# Patient Record
Sex: Male | Born: 1950 | Race: White | Hispanic: No | Marital: Married | Smoking: Never smoker
Health system: Southern US, Community
[De-identification: ages and names within clinical notes are randomized; demographics above are authoritative.]

## PROBLEM LIST (undated history)

## (undated) DIAGNOSIS — E669 Obesity, unspecified: Secondary | ICD-10-CM

## (undated) DIAGNOSIS — R7983 Abnormal findings of blood amino-acid level: Secondary | ICD-10-CM

## (undated) DIAGNOSIS — N184 Chronic kidney disease, stage 4 (severe): Secondary | ICD-10-CM

## (undated) DIAGNOSIS — K859 Acute pancreatitis without necrosis or infection, unspecified: Secondary | ICD-10-CM

## (undated) DIAGNOSIS — I2510 Atherosclerotic heart disease of native coronary artery without angina pectoris: Secondary | ICD-10-CM

## (undated) DIAGNOSIS — E7211 Homocystinuria: Secondary | ICD-10-CM

## (undated) DIAGNOSIS — F3162 Bipolar disorder, current episode mixed, moderate: Secondary | ICD-10-CM

## (undated) DIAGNOSIS — G4730 Sleep apnea, unspecified: Secondary | ICD-10-CM

## (undated) DIAGNOSIS — G463 Brain stem stroke syndrome: Secondary | ICD-10-CM

## (undated) DIAGNOSIS — M519 Unspecified thoracic, thoracolumbar and lumbosacral intervertebral disc disorder: Secondary | ICD-10-CM

## (undated) DIAGNOSIS — I10 Essential (primary) hypertension: Secondary | ICD-10-CM

## (undated) DIAGNOSIS — E785 Hyperlipidemia, unspecified: Secondary | ICD-10-CM

## (undated) DIAGNOSIS — N200 Calculus of kidney: Secondary | ICD-10-CM

## (undated) DIAGNOSIS — N400 Enlarged prostate without lower urinary tract symptoms: Secondary | ICD-10-CM

## (undated) HISTORY — DX: Homocystinuria: E72.11

## (undated) HISTORY — DX: Calculus of kidney: N20.0

## (undated) HISTORY — DX: Hyperlipidemia, unspecified: E78.5

## (undated) HISTORY — DX: Abnormal findings of blood amino-acid level: R79.83

## (undated) HISTORY — DX: Sleep apnea, unspecified: G47.30

## (undated) HISTORY — PX: ANKLE SURGERY: SHX546

## (undated) HISTORY — DX: Essential (primary) hypertension: I10

## (undated) HISTORY — PX: NASAL SEPTUM SURGERY: SHX37

## (undated) HISTORY — DX: Brain stem stroke syndrome: G46.3

## (undated) HISTORY — PX: COSMETIC SURGERY: SHX468

## (undated) HISTORY — DX: Bipolar disorder, current episode mixed, moderate: F31.62

## (undated) HISTORY — DX: Benign prostatic hyperplasia without lower urinary tract symptoms: N40.0

## (undated) HISTORY — PX: LIPOSUCTION: SHX10

## (undated) HISTORY — PX: ANKLE ARTHROSCOPY: SUR85

## (undated) HISTORY — DX: Obesity, unspecified: E66.9

## (undated) HISTORY — DX: Atherosclerotic heart disease of native coronary artery without angina pectoris: I25.10

---

## 1998-10-29 ENCOUNTER — Ambulatory Visit (HOSPITAL_COMMUNITY): Admission: RE | Admit: 1998-10-29 | Discharge: 1998-10-29 | Payer: Self-pay | Admitting: Internal Medicine

## 1998-10-29 ENCOUNTER — Encounter: Payer: Self-pay | Admitting: Internal Medicine

## 1998-11-13 ENCOUNTER — Encounter: Payer: Self-pay | Admitting: Internal Medicine

## 1998-11-13 ENCOUNTER — Ambulatory Visit (HOSPITAL_COMMUNITY): Admission: RE | Admit: 1998-11-13 | Discharge: 1998-11-13 | Payer: Self-pay | Admitting: Internal Medicine

## 2001-05-18 ENCOUNTER — Encounter: Payer: Self-pay | Admitting: Internal Medicine

## 2001-05-18 ENCOUNTER — Inpatient Hospital Stay (HOSPITAL_COMMUNITY): Admission: EM | Admit: 2001-05-18 | Discharge: 2001-05-20 | Payer: Self-pay | Admitting: Emergency Medicine

## 2001-05-19 ENCOUNTER — Encounter: Payer: Self-pay | Admitting: Internal Medicine

## 2002-06-29 ENCOUNTER — Encounter: Payer: Self-pay | Admitting: Internal Medicine

## 2004-07-22 ENCOUNTER — Encounter: Admission: RE | Admit: 2004-07-22 | Discharge: 2004-07-22 | Payer: Self-pay | Admitting: Sports Medicine

## 2004-12-09 ENCOUNTER — Ambulatory Visit: Payer: Self-pay | Admitting: Internal Medicine

## 2006-02-11 ENCOUNTER — Ambulatory Visit: Payer: Self-pay | Admitting: Internal Medicine

## 2006-02-11 LAB — CONVERTED CEMR LAB
ALT: 27 units/L (ref 0–40)
AST: 19 units/L (ref 0–37)
Albumin: 4.4 g/dL (ref 3.5–5.2)
Alkaline Phosphatase: 75 units/L (ref 39–117)
BUN: 49 mg/dL — ABNORMAL HIGH (ref 6–23)
Basophils Absolute: 0.1 10*3/uL (ref 0.0–0.1)
Basophils Relative: 0.6 % (ref 0.0–1.0)
CO2: 25 meq/L (ref 19–32)
Calcium: 9.8 mg/dL (ref 8.4–10.5)
Chloride: 107 meq/L (ref 96–112)
Chol/HDL Ratio, serum: 6
Cholesterol: 239 mg/dL (ref 0–200)
Creatinine, Ser: 2.3 mg/dL — ABNORMAL HIGH (ref 0.4–1.5)
Eosinophil percent: 2.9 % (ref 0.0–5.0)
GFR calc non Af Amer: 32 mL/min
Glomerular Filtration Rate, Af Am: 38 mL/min/{1.73_m2}
Glucose, Bld: 94 mg/dL (ref 70–99)
HCT: 46.8 % (ref 39.0–52.0)
HDL: 40 mg/dL (ref >39.0)
Hemoglobin: 14.9 g/dL (ref 13.0–17.0)
LDL DIRECT: 194.7 mg/dL
Lymphocytes Relative: 28.1 % (ref 12.0–46.0)
MCHC: 31.7 g/dL (ref 30.0–36.0)
MCV: 84.3 fL (ref 78.0–100.0)
Monocytes Absolute: 1 10*3/uL — ABNORMAL HIGH (ref 0.2–0.7)
Monocytes Relative: 10.1 % (ref 3.0–11.0)
Neutro Abs: 5.8 10*3/uL (ref 1.4–7.7)
Neutrophils Relative %: 58.3 % (ref 43.0–77.0)
PSA: 1.33 ng/mL (ref 0.10–4.00)
Platelets: 272 10*3/uL (ref 150–400)
Potassium: 4.3 meq/L (ref 3.5–5.1)
RBC: 5.55 M/uL (ref 4.22–5.81)
RDW: 13.9 % (ref 11.5–14.6)
Sodium: 140 meq/L (ref 135–145)
TSH: 6.61 microintl units/mL — ABNORMAL HIGH (ref 0.35–5.50)
Total Bilirubin: 0.8 mg/dL (ref 0.3–1.2)
Total Protein: 7.1 g/dL (ref 6.0–8.3)
Triglyceride fasting, serum: 103 mg/dL (ref 0–149)
VLDL: 21 mg/dL (ref 0–40)
WBC: 10 10*3/uL (ref 4.5–10.5)

## 2006-02-25 ENCOUNTER — Ambulatory Visit: Payer: Self-pay | Admitting: Internal Medicine

## 2006-04-01 ENCOUNTER — Ambulatory Visit: Payer: Self-pay | Admitting: Internal Medicine

## 2006-06-30 ENCOUNTER — Ambulatory Visit: Payer: Self-pay | Admitting: Internal Medicine

## 2006-06-30 LAB — CONVERTED CEMR LAB
ALT: 19 units/L (ref 0–40)
AST: 16 units/L (ref 0–37)
Albumin: 3.7 g/dL (ref 3.5–5.2)
Alkaline Phosphatase: 75 units/L (ref 39–117)
BUN: 35 mg/dL — ABNORMAL HIGH (ref 6–23)
Bilirubin, Direct: 0.1 mg/dL (ref 0.0–0.3)
CO2: 27 meq/L (ref 19–32)
Calcium: 8.9 mg/dL (ref 8.4–10.5)
Chloride: 111 meq/L (ref 96–112)
Cholesterol: 249 mg/dL (ref 0–200)
Creatinine, Ser: 1.7 mg/dL — ABNORMAL HIGH (ref 0.4–1.5)
Creatinine,U: 79.3 mg/dL
Direct LDL: 177.7 mg/dL
Free T4: 0.6 ng/dL (ref 0.6–1.6)
GFR calc Af Amer: 54 mL/min
GFR calc non Af Amer: 45 mL/min
Glucose, Bld: 113 mg/dL — ABNORMAL HIGH (ref 70–99)
HDL: 42.1 mg/dL (ref >39.0)
Hgb A1c MFr Bld: 6.5 % — ABNORMAL HIGH (ref 4.6–6.0)
Microalb Creat Ratio: 11.3 mg/g (ref 0.0–30.0)
Microalb, Ur: 0.9 mg/dL (ref 0.0–1.9)
Potassium: 4.6 meq/L (ref 3.5–5.1)
Sodium: 144 meq/L (ref 135–145)
TSH: 4.71 microintl units/mL (ref 0.35–5.50)
Total Bilirubin: 0.6 mg/dL (ref 0.3–1.2)
Total CHOL/HDL Ratio: 5.9
Total Protein: 6.7 g/dL (ref 6.0–8.3)
Triglycerides: 119 mg/dL (ref 0–149)
VLDL: 24 mg/dL (ref 0–40)

## 2006-08-19 ENCOUNTER — Ambulatory Visit: Payer: Self-pay | Admitting: Internal Medicine

## 2006-08-21 DIAGNOSIS — F3162 Bipolar disorder, current episode mixed, moderate: Secondary | ICD-10-CM | POA: Insufficient documentation

## 2006-08-21 DIAGNOSIS — I119 Hypertensive heart disease without heart failure: Secondary | ICD-10-CM | POA: Insufficient documentation

## 2006-08-21 DIAGNOSIS — Z87442 Personal history of urinary calculi: Secondary | ICD-10-CM | POA: Insufficient documentation

## 2006-08-21 DIAGNOSIS — M109 Gout, unspecified: Secondary | ICD-10-CM | POA: Insufficient documentation

## 2006-08-24 DIAGNOSIS — Z87438 Personal history of other diseases of male genital organs: Secondary | ICD-10-CM | POA: Insufficient documentation

## 2006-12-21 ENCOUNTER — Telehealth: Payer: Self-pay | Admitting: Internal Medicine

## 2006-12-30 ENCOUNTER — Ambulatory Visit: Payer: Self-pay | Admitting: Internal Medicine

## 2007-01-11 ENCOUNTER — Telehealth: Payer: Self-pay | Admitting: Internal Medicine

## 2007-01-13 ENCOUNTER — Telehealth: Payer: Self-pay | Admitting: Internal Medicine

## 2007-01-26 ENCOUNTER — Encounter: Admission: RE | Admit: 2007-01-26 | Discharge: 2007-01-26 | Payer: Self-pay | Admitting: Internal Medicine

## 2007-01-28 ENCOUNTER — Telehealth: Payer: Self-pay | Admitting: Internal Medicine

## 2007-02-15 ENCOUNTER — Encounter: Payer: Self-pay | Admitting: Internal Medicine

## 2007-04-28 ENCOUNTER — Encounter: Payer: Self-pay | Admitting: Internal Medicine

## 2007-05-05 ENCOUNTER — Ambulatory Visit: Payer: Self-pay | Admitting: Internal Medicine

## 2007-05-05 DIAGNOSIS — G4730 Sleep apnea, unspecified: Secondary | ICD-10-CM | POA: Insufficient documentation

## 2007-05-24 ENCOUNTER — Ambulatory Visit: Payer: Self-pay | Admitting: Internal Medicine

## 2007-05-24 DIAGNOSIS — E785 Hyperlipidemia, unspecified: Secondary | ICD-10-CM | POA: Insufficient documentation

## 2007-05-24 LAB — CONVERTED CEMR LAB
ALT: 29 units/L (ref 0–53)
Albumin: 4 g/dL (ref 3.5–5.2)
Alkaline Phosphatase: 93 units/L (ref 39–117)
Chloride: 110 meq/L (ref 96–112)
Direct LDL: 214.1 mg/dL
GFR calc Af Amer: 43 mL/min
Glucose, Bld: 124 mg/dL — ABNORMAL HIGH (ref 70–99)
Hgb A1c MFr Bld: 7.2 % — ABNORMAL HIGH (ref 4.6–6.0)
Potassium: 4.8 meq/L (ref 3.5–5.1)
Sodium: 141 meq/L (ref 135–145)
Total Bilirubin: 0.7 mg/dL (ref 0.3–1.2)
Total CHOL/HDL Ratio: 6.5
Triglycerides: 180 mg/dL — ABNORMAL HIGH (ref 0–149)
Uric Acid, Serum: 10.3 mg/dL — ABNORMAL HIGH (ref 2.4–7.0)

## 2007-06-09 DIAGNOSIS — G463 Brain stem stroke syndrome: Secondary | ICD-10-CM

## 2007-06-09 HISTORY — DX: Brain stem stroke syndrome: G46.3

## 2007-06-16 ENCOUNTER — Ambulatory Visit: Payer: Self-pay | Admitting: Internal Medicine

## 2007-09-06 ENCOUNTER — Ambulatory Visit: Payer: Self-pay | Admitting: Internal Medicine

## 2007-10-04 ENCOUNTER — Encounter: Payer: Self-pay | Admitting: Internal Medicine

## 2007-10-04 ENCOUNTER — Telehealth: Payer: Self-pay | Admitting: Internal Medicine

## 2007-10-05 ENCOUNTER — Telehealth: Payer: Self-pay | Admitting: Internal Medicine

## 2007-10-07 ENCOUNTER — Encounter: Payer: Self-pay | Admitting: Internal Medicine

## 2007-10-08 ENCOUNTER — Inpatient Hospital Stay (HOSPITAL_COMMUNITY): Admission: AD | Admit: 2007-10-08 | Discharge: 2007-10-11 | Payer: Self-pay | Admitting: Neurology

## 2007-10-08 ENCOUNTER — Encounter (INDEPENDENT_AMBULATORY_CARE_PROVIDER_SITE_OTHER): Payer: Self-pay | Admitting: Neurology

## 2007-10-08 ENCOUNTER — Ambulatory Visit: Payer: Self-pay | Admitting: Cardiovascular Disease

## 2007-10-11 ENCOUNTER — Ambulatory Visit: Payer: Self-pay | Admitting: Physical Medicine & Rehabilitation

## 2007-10-11 ENCOUNTER — Inpatient Hospital Stay (HOSPITAL_COMMUNITY)
Admission: RE | Admit: 2007-10-11 | Discharge: 2007-10-16 | Payer: Self-pay | Admitting: Physical Medicine & Rehabilitation

## 2007-10-21 ENCOUNTER — Encounter
Admission: RE | Admit: 2007-10-21 | Discharge: 2007-12-07 | Payer: Self-pay | Admitting: Physical Medicine & Rehabilitation

## 2007-10-28 ENCOUNTER — Encounter
Admission: RE | Admit: 2007-10-28 | Discharge: 2007-10-29 | Payer: Self-pay | Admitting: Physical Medicine & Rehabilitation

## 2007-10-29 ENCOUNTER — Ambulatory Visit: Payer: Self-pay | Admitting: Physical Medicine & Rehabilitation

## 2007-11-18 ENCOUNTER — Telehealth: Payer: Self-pay | Admitting: Internal Medicine

## 2007-11-24 ENCOUNTER — Ambulatory Visit: Payer: Self-pay | Admitting: Internal Medicine

## 2007-11-24 DIAGNOSIS — Z8673 Personal history of transient ischemic attack (TIA), and cerebral infarction without residual deficits: Secondary | ICD-10-CM | POA: Insufficient documentation

## 2007-11-28 DIAGNOSIS — E1165 Type 2 diabetes mellitus with hyperglycemia: Secondary | ICD-10-CM

## 2007-11-28 DIAGNOSIS — E1129 Type 2 diabetes mellitus with other diabetic kidney complication: Secondary | ICD-10-CM | POA: Insufficient documentation

## 2008-02-18 ENCOUNTER — Telehealth: Payer: Self-pay | Admitting: Internal Medicine

## 2008-02-21 ENCOUNTER — Ambulatory Visit: Payer: Self-pay | Admitting: Internal Medicine

## 2008-02-21 LAB — CONVERTED CEMR LAB
ALT: 27 units/L (ref 0–53)
Alkaline Phosphatase: 88 units/L (ref 39–117)
BUN: 45 mg/dL — ABNORMAL HIGH (ref 6–23)
Calcium: 9.7 mg/dL (ref 8.4–10.5)
Chloride: 108 meq/L (ref 96–112)
Cholesterol: 213 mg/dL (ref 0–200)
Creatinine, Ser: 2.3 mg/dL — ABNORMAL HIGH (ref 0.4–1.5)
Glucose, Bld: 137 mg/dL — ABNORMAL HIGH (ref 70–99)
Potassium: 4.4 meq/L (ref 3.5–5.1)
Sodium: 142 meq/L (ref 135–145)
Total Protein: 6.7 g/dL (ref 6.0–8.3)

## 2008-02-23 ENCOUNTER — Ambulatory Visit: Payer: Self-pay | Admitting: Internal Medicine

## 2008-04-05 ENCOUNTER — Encounter: Payer: Self-pay | Admitting: Internal Medicine

## 2008-04-19 ENCOUNTER — Telehealth: Payer: Self-pay | Admitting: *Deleted

## 2008-04-24 ENCOUNTER — Telehealth: Payer: Self-pay | Admitting: Internal Medicine

## 2008-04-27 ENCOUNTER — Telehealth: Payer: Self-pay | Admitting: Internal Medicine

## 2008-09-04 ENCOUNTER — Telehealth: Payer: Self-pay | Admitting: Internal Medicine

## 2009-03-05 ENCOUNTER — Telehealth (INDEPENDENT_AMBULATORY_CARE_PROVIDER_SITE_OTHER): Payer: Self-pay | Admitting: *Deleted

## 2009-05-15 ENCOUNTER — Encounter: Payer: Self-pay | Admitting: Cardiology

## 2009-06-11 ENCOUNTER — Inpatient Hospital Stay (HOSPITAL_COMMUNITY): Admission: EM | Admit: 2009-06-11 | Discharge: 2009-06-13 | Payer: Self-pay | Admitting: Emergency Medicine

## 2009-06-11 ENCOUNTER — Encounter: Payer: Self-pay | Admitting: Cardiology

## 2009-06-11 ENCOUNTER — Ambulatory Visit: Payer: Self-pay | Admitting: Cardiology

## 2009-06-11 ENCOUNTER — Telehealth: Payer: Self-pay | Admitting: Internal Medicine

## 2009-06-13 ENCOUNTER — Encounter: Payer: Self-pay | Admitting: Cardiology

## 2009-06-13 ENCOUNTER — Ambulatory Visit: Payer: Self-pay | Admitting: Thoracic Surgery (Cardiothoracic Vascular Surgery)

## 2009-06-13 ENCOUNTER — Encounter: Payer: Self-pay | Admitting: Thoracic Surgery (Cardiothoracic Vascular Surgery)

## 2009-06-14 ENCOUNTER — Ambulatory Visit: Payer: Self-pay | Admitting: Cardiology

## 2009-06-14 LAB — CONVERTED CEMR LAB
Calcium: 9.2 mg/dL (ref 8.4–10.5)
Chloride: 111 meq/L (ref 96–112)
Creatinine, Ser: 2.4 mg/dL — ABNORMAL HIGH (ref 0.4–1.5)
GFR calc non Af Amer: 29.85 mL/min (ref >60)
Potassium: 4.4 meq/L (ref 3.5–5.1)

## 2009-06-19 ENCOUNTER — Ambulatory Visit: Payer: Self-pay | Admitting: Internal Medicine

## 2009-06-19 DIAGNOSIS — Z951 Presence of aortocoronary bypass graft: Secondary | ICD-10-CM | POA: Insufficient documentation

## 2009-06-20 ENCOUNTER — Telehealth: Payer: Self-pay | Admitting: Internal Medicine

## 2009-06-20 DIAGNOSIS — E7210 Disorders of sulfur-bearing amino-acid metabolism, unspecified: Secondary | ICD-10-CM | POA: Insufficient documentation

## 2009-06-20 DIAGNOSIS — E669 Obesity, unspecified: Secondary | ICD-10-CM | POA: Insufficient documentation

## 2009-06-21 DIAGNOSIS — E739 Lactose intolerance, unspecified: Secondary | ICD-10-CM | POA: Insufficient documentation

## 2009-06-21 LAB — CONVERTED CEMR LAB
BUN: 58 mg/dL — ABNORMAL HIGH (ref 6–23)
CO2: 22 meq/L (ref 19–32)
Calcium: 9.1 mg/dL (ref 8.4–10.5)
Chloride: 107 meq/L (ref 96–112)
Creatinine, Ser: 2.6 mg/dL — ABNORMAL HIGH (ref 0.4–1.5)
Potassium: 4.6 meq/L (ref 3.5–5.1)
Sodium: 141 meq/L (ref 135–145)

## 2009-06-25 ENCOUNTER — Ambulatory Visit: Payer: Self-pay | Admitting: Internal Medicine

## 2009-06-26 ENCOUNTER — Ambulatory Visit: Payer: Self-pay | Admitting: Cardiology

## 2009-06-26 LAB — CONVERTED CEMR LAB
Calcium: 8.8 mg/dL (ref 8.4–10.5)
Glucose, Bld: 130 mg/dL — ABNORMAL HIGH (ref 70–99)
Potassium: 4.2 meq/L (ref 3.5–5.1)
Sodium: 141 meq/L (ref 135–145)

## 2009-07-16 ENCOUNTER — Telehealth: Payer: Self-pay | Admitting: Cardiology

## 2009-07-17 ENCOUNTER — Telehealth: Payer: Self-pay | Admitting: Internal Medicine

## 2009-07-23 ENCOUNTER — Ambulatory Visit: Payer: Self-pay | Admitting: Thoracic Surgery (Cardiothoracic Vascular Surgery)

## 2009-07-25 ENCOUNTER — Inpatient Hospital Stay (HOSPITAL_COMMUNITY)
Admission: RE | Admit: 2009-07-25 | Discharge: 2009-07-31 | Payer: Self-pay | Admitting: Thoracic Surgery (Cardiothoracic Vascular Surgery)

## 2009-07-25 ENCOUNTER — Encounter: Payer: Self-pay | Admitting: Thoracic Surgery (Cardiothoracic Vascular Surgery)

## 2009-07-25 ENCOUNTER — Ambulatory Visit: Payer: Self-pay | Admitting: Thoracic Surgery (Cardiothoracic Vascular Surgery)

## 2009-07-25 ENCOUNTER — Ambulatory Visit: Payer: Self-pay | Admitting: Cardiology

## 2009-07-25 HISTORY — PX: CORONARY ARTERY BYPASS GRAFT: SHX141

## 2009-07-26 ENCOUNTER — Encounter: Payer: Self-pay | Admitting: Cardiology

## 2009-08-13 ENCOUNTER — Encounter
Admission: RE | Admit: 2009-08-13 | Discharge: 2009-08-13 | Payer: Self-pay | Admitting: Thoracic Surgery (Cardiothoracic Vascular Surgery)

## 2009-08-13 ENCOUNTER — Ambulatory Visit: Payer: Self-pay | Admitting: Thoracic Surgery (Cardiothoracic Vascular Surgery)

## 2009-08-13 ENCOUNTER — Encounter: Payer: Self-pay | Admitting: Cardiology

## 2009-08-22 ENCOUNTER — Telehealth: Payer: Self-pay | Admitting: Internal Medicine

## 2009-08-23 ENCOUNTER — Encounter: Payer: Self-pay | Admitting: Cardiology

## 2009-09-21 ENCOUNTER — Telehealth: Payer: Self-pay | Admitting: Cardiology

## 2009-09-25 ENCOUNTER — Telehealth: Payer: Self-pay | Admitting: Cardiology

## 2009-10-08 ENCOUNTER — Telehealth: Payer: Self-pay | Admitting: Cardiology

## 2009-10-26 ENCOUNTER — Telehealth: Payer: Self-pay | Admitting: Cardiology

## 2009-11-02 ENCOUNTER — Telehealth: Payer: Self-pay | Admitting: Internal Medicine

## 2009-11-06 ENCOUNTER — Telehealth: Payer: Self-pay | Admitting: Internal Medicine

## 2009-11-19 ENCOUNTER — Ambulatory Visit: Payer: Self-pay | Admitting: Cardiology

## 2009-12-07 ENCOUNTER — Telehealth: Payer: Self-pay | Admitting: Cardiology

## 2010-02-12 ENCOUNTER — Ambulatory Visit: Payer: Self-pay | Admitting: Family Medicine

## 2010-03-08 ENCOUNTER — Encounter: Payer: Self-pay | Admitting: Cardiology

## 2010-03-31 ENCOUNTER — Encounter: Payer: Self-pay | Admitting: Sports Medicine

## 2010-04-08 ENCOUNTER — Telehealth (INDEPENDENT_AMBULATORY_CARE_PROVIDER_SITE_OTHER): Payer: Self-pay | Admitting: *Deleted

## 2010-04-08 ENCOUNTER — Telehealth: Payer: Self-pay | Admitting: Family Medicine

## 2010-04-11 NOTE — Progress Notes (Signed)
Summary: unable to sleep - pt on metoprolol  Phone Note Call from Patient Call back at [facility] Phone [phone] Call back at Work Phone [phone]   Caller: Patient Reason for Call: Talk to Nurse Summary of Call: Per pt call, pt taken METOPROLOL TARTRATE 50 MG TABS Take one tablet by mouth twice a day. c/o pt read online to sided effect of meds. unable to sleep.  Initial call taken by: Lorne Skeens,  September 25, 2009 3:16 PM  Follow-up for Phone Call        Pt. has had difficulty sleeping last couple of nights and read that metoprolol can cause insomnia. I told pt that this was not a common side effect of metoprolol. Pt also states he has a cold and is wondering what over the counter medications he can take.  I told pt that he should not take decongestants but that antihistamines were usually OK to take Follow-up by: Dossie Arbour, RN, BSN,  September 25, 2009 3:56 PM

## 2010-04-11 NOTE — Miscellaneous (Signed)
Summary: [facility] Discharge Report   [facility] Discharge Report   Imported By: Roderic Ovens 04/05/2010 12:58:20  _____________________________________________________________________  External Attachment:    Type:   Image     Comment:   External Document

## 2010-04-11 NOTE — Progress Notes (Signed)
Summary: Pt request call and he has information about surgery  Phone Note Call from Patient Call back at Work Phone [phone]   Reason for Call: Talk to Nurse Initial call taken by: Judie Grieve,  Jul 16, 2009 8:50 AM  Follow-up for Phone Call        spoke with pt, he has moved his surgery date up to may 18th. he has noticed over the last week or so a tightness or soreness in his chest. he notes it usually occurs late in the day and does not feel like the angina he has had. he can not reproduce the pain and today he feels great. this tightness is not related to exerction, he has not really exercised since discharge. he has SOB if he climbs alot of stairs but not general SOB. he also wanted to let us know he had repeat labs with dr swords and his kidney function was much better. will foward for dr Jens Som review Deliah Goody, RN  Jul 16, 2009 11:00 AM   Additional Follow-up for Phone Call Additional follow up Details #1::        ok Ferman Hamming, MD, [facility]  Jul 16, 2009 11:01 AM

## 2010-04-11 NOTE — Progress Notes (Signed)
Summary: gout & surgery  Phone Note Call from Patient Call back at Work Phone [phone]   Call For: ellen todd Summary of Call: 1)Allopurinol every day, but he still has gout.  2)Surgery date moved up to 5-18 from 6-2.  Call back please.   Initial call taken by: Rudy Jew, RN,  Jul 17, 2009 10:24 AM  Follow-up for Phone Call        I HAVE ALLOPURINOL AS 1/2 once daily INCREASE TO 1 by mouth once daily  if already on 1 by mouth once daily change to uloric 40 mg by mouth once daily  Follow-up by: Birdie Sons MD,  Jul 17, 2009 11:58 AM  Additional Follow-up for Phone Call Additional follow up Details #1::        Phone Call Completed.  Says yes to 1/2 daily.  Will up to 1 daily.  Does not need rx updated.  He has plenty on hand.   Additional Follow-up by: Rudy Jew, RN,  Jul 17, 2009 12:19 PM    New/Updated Medications: ALLOPURINOL 300 MG TABS (ALLOPURINOL) Take 1 every day

## 2010-04-11 NOTE — Progress Notes (Signed)
Summary: Pt request call   Phone Note Call from Patient Call back at Home Phone [phone]   Caller: Patient Summary of Call: Pt have question about B/P Initial call taken by: Judie Grieve,  October 08, 2009 11:35 AM  Follow-up for Phone Call        Left message to call back Deliah Goody, RN  October 08, 2009 11:56 AM  Left message to call back Deliah Goody, RN  October 11, 2009 4:39 PM  pt never returned calls Deliah Goody, RN  October 15, 2009 2:09 PM

## 2010-04-11 NOTE — Progress Notes (Signed)
Summary: FYI  Phone Note Call from Patient Call back at [phone]   Caller: Patient Summary of Call: Pt is in [facility] for chest pain. [facility] Dr Bretta Bang . Pt is in room #2030. Initial call taken by: Lucy Antigua,  June 11, 2009 10:49 AM

## 2010-04-11 NOTE — Letter (Signed)
Summary: Newpaper Announcement   [employer]paper Announcement   Imported By: Roderic Ovens 06/28/2009 14:32:46  _____________________________________________________________________  External Attachment:    Type:   Image     Comment:   External Document

## 2010-04-11 NOTE — Progress Notes (Signed)
Summary: prednisone & pain med  Phone Note Call from Patient Call back at Work Phone [phone]   Summary of Call: Having a gout flareup.  In severe pain with the affected knee.  Started on Allopurinol yesterday, but no help & worse.   Requesting Prednisone & pain med as before.  [facility] Initial call taken by: Rudy Jew, RN,  June 20, 2009 1:53 PM  Follow-up for Phone Call        recommned colcrys .6 two times a day first line would defer pred to dr swords may call in  number 20 vicodin  5/500  one by mouth q 6  Per Dr. Shela Commons stop allopurinol Rudy Jew, RN  June 20, 2009 2:43 PM  Follow-up by: Stacie Glaze MD,  June 20, 2009 2:07 PM  Additional Follow-up for Phone Call Additional follow up Details #1::        Phone Call Completed.  Per patient he will not need the prednisone if he gets something that helps with the pain.  Rudy Jew, RN  June 20, 2009 2:46 PM  Cannot add meds as unsigned note in progress. Rudy Jew, RN  June 20, 2009 2:45 PM  Additional Follow-up by: Rudy Jew, RN,  June 20, 2009 2:44 PM    Additional Follow-up for Phone Call Additional follow up Details #2::    prednisone 20 mg by mouth once daily. for 5 days allopurinol 300 mg 1/2 by mouth once daily  Follow-up by: Birdie Sons MD,  June 21, 2009 6:20 AM  Additional Follow-up for Phone Call Additional follow up Details #3:: Details for Additional Follow-up Action Taken: Left message on personal voice mail to informpt.  Pt called back and was told same. Additional Follow-up by: Gladis Riffle, RN,  June 21, 2009 8:18 AM  New/Updated Medications: ALLOPURINOL 300 MG TABS (ALLOPURINOL) Take 1/2 tab every day VICODIN 5-500 MG TABS (HYDROCODONE-ACETAMINOPHEN) One by mouth every 6 hours as needed pain PREDNISONE 20 MG TABS (PREDNISONE) Take 1 tablet by mouth once a day x 5 days Prescriptions: PREDNISONE 20 MG TABS (PREDNISONE) Take 1 tablet by mouth once a day  x 5 days  #5 x 0   Entered by:   Gladis Riffle, RN   Authorized by:   Birdie Sons MD   Signed by:   Gladis Riffle, RN on 06/21/2009   Method used:   [facility]        [facility] [address] [phone]* (retail)       [address]       [place], [place]  [address]       Ph: [phone] or [phone]       Fax: [phone]   RxID:   [id number]

## 2010-04-11 NOTE — Progress Notes (Signed)
Summary: PT REQUEST CALL REGARDING B/P  Phone Note Call from Patient Call back at Work Phone [phone]   Caller: Patient Summary of Call: PT REQUEST CALL BACK REGARDING HIS B/P Initial call taken by: Judie Grieve,  September 21, 2009 11:05 AM  Follow-up for Phone Call        spoke with pt, he has noticed the last two week there are times his bp is elevated when he goes to cardiac rehab and they will not let him exercise. he relates it to eating a high sodium meal the night before. he denies other problems will discuss with dr Jens Som Deliah Goody, RN  September 21, 2009 12:55 PM  discussed with dr Jens Som, pt to start amlodipine 5mg  once daily. he will let us know of any problems Deliah Goody, RN  September 21, 2009 12:58 PM     New/Updated Medications: AMLODIPINE BESYLATE 5 MG TABS (AMLODIPINE BESYLATE) Take one tablet by mouth daily Prescriptions: AMLODIPINE BESYLATE 5 MG TABS (AMLODIPINE BESYLATE) Take one tablet by mouth daily  #30 x 12   Entered by:   Deliah Goody, RN   Authorized by:   Ferman Hamming, MD, [facility]   Signed by:   Deliah Goody, RN on 09/21/2009   Method used:   Electronically to        [facility] [address] [phone]* (retail)       [address]       [place], [place]  [address]       Ph: [phone] or [phone]       Fax: [phone]   RxID:   [id number]

## 2010-04-11 NOTE — Progress Notes (Signed)
Summary: c/o numbness in face/ h/a this am / refill plavix today  Phone Note Call from Patient Call back at [facility] Phone [phone] Call back at Work Phone [phone]   Caller: Spouse-  nancy [phone] phone.  Reason for Call: Talk to Nurse Complaint: Headache Summary of Call: Per pt wife calling, Pt was told by Dr. Jens Som to stop taken plavix. h/o heart surgery. now pt experenice c/o numbness in face, h/a this a.m.  pt traveling to [place] today.  wife/ pt is  concerns that pt need to be  back on plavix immediately.  [facility]ix [facility] [address]  this need to be called in today.  Initial call taken by: Lorne Skeens,  December 07, 2009 9:48 AM  Follow-up for Phone Call        [facility] Scherrie Bateman, LPN  December 07, 2009 10:00 AM  SPOKE WITH PT  STATED WAS ON PLAVIX FOR HX OF STROKE LEFT MESSAGE EARLIER FOR DR Jens Som  TO LET ME KNOW IF MAY RESTART PLAVIX Scherrie Bateman, LPN  December 07, 2009 11:39 AM wife returned call very concerned with husbands symptoms informed wife awaiting return call from dr Jens Som Scherrie Bateman, LPN  December 07, 2009 11:40 AM LEFT MESSAGE ON PT'S  VOICE MAIL THAT PLAVIX WAS CALLED IN AND WILL NOTIFY WIFE AS WELL. Follow-up by: Scherrie Bateman, LPN,  December 07, 2009 1:49 PM  Additional Follow-up for Phone Call Additional follow up Details #1::        LEFT MESSAGE ON WIFE'S VM THAT [facility].OKAY TO RESTART PER DR BRODIE Additional Follow-up by: Scherrie Bateman, LPN,  December 07, 2009 1:51 PM    Additional Follow-up for Phone Call Additional follow up Details #2::    ok to resume plavix Ferman Hamming, MD, [facility]  December 08, 2009 11:38 AM   New/Updated Medications: PLAVIX 75 MG TABS (CLOPIDOGREL BISULFATE) 1 once daily Prescriptions: PLAVIX 75 MG TABS (CLOPIDOGREL BISULFATE) 1 once daily  #30 x 6   Entered by:   Scherrie Bateman, LPN   Authorized by:   Lenoria Farrier, MD,  [facility]   Signed by:   Scherrie Bateman, LPN on 47/42/5956   Method used:   Electronically to        [facility] [address] [phone]* (retail)       [address]       [place], [place]  [address]       Ph: [phone] or [phone]       Fax: [phone]   RxID:   [id number]

## 2010-04-11 NOTE — Progress Notes (Signed)
  Phone Note Call from Patient   Caller: Patient Call For: Birdie Sons MD Summary of Call: Pt is having a flare of gout in Left big toe. Doing Cardiac Rehab at [facility] Prednisone and Hydrocodone. [facility] [phone] Initial call taken by: Lynann Beaver CMA,  August 22, 2009 8:13 AM    New/Updated Medications: HYDROCODONE-ACETAMINOPHEN 5-325 MG TABS (HYDROCODONE-ACETAMINOPHEN) one by mouth q 4 hours as needed pain PREDNISONE 20 MG TABS (PREDNISONE) 2 by mouth q day x 5 days Prescriptions: PREDNISONE 20 MG TABS (PREDNISONE) 2 by mouth q day x 5 days  #10 x 0   Entered by:   Lynann Beaver CMA   Authorized by:   Birdie Sons MD   Signed by:   Lynann Beaver CMA on 08/22/2009   Method used:   [facility] ...       [phone]       [address] [place], [place]  [address]       Ph: [phone]       Fax: [phone]   RxID:   [id number] HYDROCODONE-ACETAMINOPHEN 5-325 MG TABS (HYDROCODONE-ACETAMINOPHEN) one by mouth q 4 hours as needed pain  #20 x 0   Entered by:   Lynann Beaver CMA   Authorized by:   Birdie Sons MD   Signed by:   Lynann Beaver CMA on 08/22/2009   Method used:   Telephoned to ...       [facility] [address] [phone]* (retail)       [address]       [place], [place]  [address]       Ph: [phone] or [phone]       Fax: [phone]   RxID:   [id number]   Appended Document:  [facility].

## 2010-04-11 NOTE — Progress Notes (Signed)
  Phone Note Call from Patient   Caller: Patient Summary of Call: recieved call from pt, he wanted to report his bp. [place] that before exercise his bp is running 145/96. after exercise his bp will be from 114/64 to 120/78, and will stay down the rest of the day. he is having no problems and has a follow up appt in sept with dr Jens Som. he wanted to let dr Jens Som know what his bp is running. will foward for dr Jens Som review Deliah Goody, RN  October 26, 2009 4:05 PM   Follow-up for Phone Call        continue to follow bp and f/u as scheduled Ferman Hamming, MD, [facility]  October 27, 2009 2:06 PM  pt aware Deliah Goody, RN  October 30, 2009 1:07 PM

## 2010-04-11 NOTE — Assessment & Plan Note (Signed)
Summary: CONCERNS W/ GOUT, DIABETES / RS/hosp fup-hbgaic per matt at h...   Vital Signs:  Patient profile:   60 year old male Pulse rate:   64 / minute Pulse rhythm:   regular Resp:     12 per minute BP sitting:   116 / 88  (left arm) Cuff size:   regular  Vitals Entered By: Gladis Riffle, RN (June 19, 2009 11:58 AM) CC: hospital FU heart attack--needs wound checked--needs refills--asking about vicodin refill--discuss diabetes, needs kidney lab Is Patient Diabetic? Yes Did you bring your meter with you today? No Comments surgery June 2 for 5 vessel disease   CC:  hospital FU heart attack--needs wound checked--needs refills--asking about vicodin refill--discuss diabetes and needs kidney lab.  History of Present Illness:  Follow-Up Visit      This is a 60 year old man who presents for Follow-up visit.  The patient denies chest pain and palpitations.  Since the last visit the patient notes a recent ED visit, a recent hospitilization, and being seen by a specialist.  The patient reports taking meds as prescribed, not monitoring BP, not monitoring blood sugars, and dietary noncompliance.  When questioned about possible medication side effects, the patient notes none.  has been compliant with meds only since leaving hospital 24 hour hx of cough---nocturnal--"kept me up all night"  All other systems reviewed and were negative   Preventive Screening-Counseling & Management  Alcohol-Tobacco     Smoking Status: never  Current Medications (verified): 1)  Lamictal 200 Mg Tabs (Lamotrigine) .... Take 1 Tablet By Mouth Once A Day 2)  Vicodin 5-500 Mg Tabs (Hydrocodone-Acetaminophen) .Marland Kitchen.. 1-2 By Mouth Once Daily As Needed 3)  Plavix 75 Mg Tabs (Clopidogrel Bisulfate) .... Take 1 Tablet By Mouth Once A Day 4)  Crestor 40 Mg Tabs (Rosuvastatin Calcium) .... Take 1 Tablet By Mouth At Bedtime 5)  Lunesta 3 Mg Tabs (Eszopiclone) .... At Bedtime As Needed 6)  Diazepam 5 Mg Tabs (Diazepam) .Marland Kitchen.. 1-2  As Needed Anxiety 7)  Prednisone 10 Mg Tabs (Prednisone) .... Daily As Needed 8)  Folbic 2.5-25-2 Mg Tabs (Fa-Pyridoxine-Cyancobalamin) .... Take 1 Tablet By Mouth Once A Day 9)  Minocycline Hcl 100 Mg Caps (Minocycline Hcl) .... Take 1 Capsule By Mouth Two Times A Day 10)  Aspirin 325 Mg Tabs (Aspirin) .... Once Daily  Allergies: 1)  ! * No Nsaids  Past History:  Past Medical History: Gout Hypertension Nephrolithiasis, hx of Renal insufficiency child mood disorder, bipolar BPH Benign prostatic hypertrophy Cerebrovascular accident, hx of Coronary artery disease---2011, see cath report, recommend CABG  Physical Exam  General:  alert and well-developed.   Head:  normocephalic and atraumatic.   Eyes:  pupils equal and pupils round.   Neck:  supple and full ROM.   Chest Wall:  no deformities and no tenderness.   Lungs:  normal respiratory effort and no intercostal retractions.   Heart:  normal rate and regular rhythm.   Abdomen:  soft and non-tender.   Msk:  No deformity or scoliosis noted of thoracic or lumbar spine.  no effusions or joint tenderness Neurologic:  cranial nerves II-XII intact and gait normal.     Impression & Recommendations:  Problem # 1:  CORONARY ARTERY DISEASE (ICD-414.00) considering CABG The following medications were removed from the medication list:    Lisinopril 20 Mg Tabs (Lisinopril) .Marland Kitchen... Take 1 tablet by mouth once a day--needs office visit for additional refills    Amlodipine Besylate 5 Mg Tabs (  Amlodipine besylate) .Marland Kitchen... Take 1 tablet by mouth once a day--needs office visit for additional refills His updated medication list for this problem includes:    Plavix 75 Mg Tabs (Clopidogrel bisulfate) .Marland Kitchen... Take 1 tablet by mouth once a day    Aspirin 325 Mg Tabs (Aspirin) ..... Once daily  Problem # 2:  DIABETES-TYPE 2 (ICD-250.00) diet , exercise and weight loss encouraged The following medications were removed from the medication list:     Lisinopril 20 Mg Tabs (Lisinopril) .Marland Kitchen... Take 1 tablet by mouth once a day--needs office visit for additional refills His updated medication list for this problem includes:    Aspirin 325 Mg Tabs (Aspirin) ..... Once daily  Orders: TLB-BMP (Basic Metabolic Panel-BMET) (80048-METABOL)  Problem # 3:  RENAL INSUFFICIENCY (ICD-588.9) has been stable recent dye load (check labs)  Problem # 4:  GOUT (ICD-274.9) pt has been noncompliant with meds resumed meds (started indomethacin---for gout---he took it despite previous warnings to avoid all NSAIDS).  The following medications were removed from the medication list:    Allopurinol 300 Mg Tabs (Allopurinol) .Marland Kitchen... Take 1 tab every day His updated medication list for this problem includes:    Allopurinol 300 Mg Tabs (Allopurinol) .Marland Kitchen... Take 1 tab every day  Complete Medication List: 1)  Lamictal 200 Mg Tabs (Lamotrigine) .... Take 1 tablet by mouth once a day 2)  Vicodin 5-500 Mg Tabs (Hydrocodone-acetaminophen) .Marland Kitchen.. 1-2 by mouth once daily as needed 3)  Plavix 75 Mg Tabs (Clopidogrel bisulfate) .... Take 1 tablet by mouth once a day 4)  Crestor 40 Mg Tabs (Rosuvastatin calcium) .... Take 1 tablet by mouth at bedtime 5)  Lunesta 3 Mg Tabs (Eszopiclone) .... At bedtime as needed 6)  Diazepam 5 Mg Tabs (Diazepam) .... Take 1 tab by mouth at bedtime as needed anxiety 7)  Minocycline Hcl 100 Mg Caps (Minocycline hcl) .... Take 1 capsule by mouth two times a day 8)  Aspirin 325 Mg Tabs (Aspirin) .... Once daily 9)  Hydromet 5-1.5 Mg/66ml Syrp (Hydrocodone-homatropine) .Marland Kitchen.. 1 teaspoon two times a day as needed cough 10)  Folic Acid 1 Mg Tabs (Folic acid) .... Take 1 tab by mouth daily 11)  Allopurinol 300 Mg Tabs (Allopurinol) .... Take 1 tab every day Prescriptions: ALLOPURINOL 300 MG TABS (ALLOPURINOL) Take 1 tab every day  #90 x 3   Entered and Authorized by:   Birdie Sons MD   Signed by:   Birdie Sons MD on 06/19/2009   Method used:    Electronically to        [facility] [address] [phone]* (retail)       [address]       [place], [place]  [address]       Ph: [phone] or [phone]       Fax: [phone]   RxID:   [id number] DIAZEPAM 5 MG TABS (DIAZEPAM) Take 1 tab by mouth at bedtime as needed anxiety  #10 x 1   Entered and Authorized by:   Birdie Sons MD   Signed by:   Birdie Sons MD on 06/19/2009   Method used:   Print then Give to Patient   RxID:   [id number] FOLIC ACID 1 MG TABS (FOLIC ACID) Take 1 tab by mouth daily  #90 x 3   Entered and Authorized by:   Birdie Sons MD   Signed by:   Birdie Sons MD on 06/19/2009   Method used:  Telephoned to ...       [facility] [address] [phone]* (retail)       [address]       [place], [place]  [address]       Ph: [phone] or [phone]       Fax: [phone]   RxID:   [id number] HYDROMET 5-1.5 MG/5ML SYRP (HYDROCODONE-HOMATROPINE) 1 teaspoon two times a day as needed cough  #120 cc x 0   Entered and Authorized by:   Birdie Sons MD   Signed by:   Birdie Sons MD on 06/19/2009   Method used:   Print then Give to Patient   RxID:   [id number]

## 2010-04-11 NOTE — Assessment & Plan Note (Signed)
Summary: rov   Primary Provider:  Dr Cato Mulligan  CC:  check up.  History of Present Illness: Pleasant gentleman recently admitted with a non-ST elevation myocardial infarction. Cardiac catheterization was performed on June 12, 2009. This revealed normal left main. The LAD had a 70% narrowing just before  the large diagonal branch and 70-80% narrowing just after the diagonal branch.  There was also 70-80% stenosis in the proximal portion of the first large diagonal branch. The second marginal branch had a 90% ostial stenosis.  There was also some 50% narrowing in the AV circumflex at that bifurcation. There was also 90% narrowing in the distal circumflex artery. The right coronary artery was a small to moderate sized vessel that gave rise to 2 right ventricular branches, a  posterior descending branch and then was completely occluded.  There was  a posterolateral branch which filled via collaterals from the posterior  branch and also filled via collaterals from the circumflex artery. There was 80% narrowing in the distal right coronary artery.  There were 95% ostial stenoses in both acute marginal branches. Note an echocardiogram showed normal LV function. Patient underwent coronary artery bypass and graft on Jul 25, 2009. Since then the patient has dyspnea with more extreme activities but not with routine activities. It is relieved with rest. It is not associated with chest pain. There is no orthopnea, PND or pedal edema. There is no syncope or palpitations. There is no exertional chest pain.   Current Medications (verified): 1)  Lamictal 200 Mg Tabs (Lamotrigine) .... Take 1 Tablet By Mouth Once A Day 2)  Hydrocodone-Acetaminophen 5-325 Mg Tabs (Hydrocodone-Acetaminophen) .... One By Mouth Q 4 Hours As Needed Pain 3)  Plavix 75 Mg Tabs (Clopidogrel Bisulfate) .... Take 1 Tablet By Mouth Once A Day 4)  Crestor 40 Mg Tabs (Rosuvastatin Calcium) .... Take 1 Tablet By Mouth At Bedtime 5)  Lunesta 3 Mg Tabs  (Eszopiclone) .... At Bedtime As Needed 6)  Diazepam 5 Mg Tabs (Diazepam) .... Take 1 Tab By Mouth At Bedtime As Needed Anxiety 7)  Minocycline Hcl 100 Mg Caps (Minocycline Hcl) .... Take 1 Capsule By Mouth Two Times A Day 8)  Aspirin 325 Mg Tabs (Aspirin) .... Once Daily 9)  Allopurinol 300 Mg Tabs (Allopurinol) .... Take 1 Every Day 10)  Metoprolol Tartrate 50 Mg Tabs (Metoprolol Tartrate) .... Take One Tablet By Mouth Twice A Day 11)  Amlodipine Besylate 5 Mg Tabs (Amlodipine Besylate) .... Take One Tablet By Mouth Daily  Allergies: 1)  ! 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  Past Surgical History: ankle arthroscopy plastic surgery chin liposuction deviated septum left ankle surgery Median sternotomy for coronary artery bypass grafting x5 (left internal mammary artery to distal left anterior descending coronary artery, saphenous vein graft to first diagonal branch, saphenous vein graft to second obtuse marginal branch with sequential saphenous vein graft to third obtuse marginal branch, saphenous vein graft to acute marginal branch of the right coronary artery, endoscopic saphenous vein harvest from right thigh and right lower leg) 07/25/09  Social History: Reviewed history from 06/26/2009 and no changes required. 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   Review of Systems  no fevers or chills, productive cough, hemoptysis, dysphasia, odynophagia, melena, hematochezia, dysuria, hematuria, rash,  seizure activity, orthopnea, PND, pedal edema, claudication. Remaining systems are negative.   Vital Signs:  Patient profile:   60 year old male Height:      72 inches Weight:      249 pounds BMI:     33.89 Pulse rate:   68 / minute Resp:     12 per minute BP sitting:   138 / 90  (left arm)  Vitals Entered By: Kem Parkinson (November 19, 2009 3:56 PM)  Physical Exam  General:  Well-developed well-nourished in no acute distress.  Skin is warm and dry.  HEENT is normal.  Neck is supple. No thyromegaly.  Chest is clear to auscultation with normal expansion.  Cardiovascular exam is regular rate and rhythm.  Abdominal exam nontender or distended. No masses palpated. Extremities show no edema. neuro grossly intact    EKG  Procedure date:  11/19/2009  Findings:      Sinus rhythm at a rate of 68. Axis normal. First degree AV block.  Impression & Recommendations:  Problem # 1:  HYPERLIPIDEMIA (ICD-272.4) Continue statin. Will check lipids and liver. His updated medication list for this problem includes:    Crestor 40 Mg Tabs (Rosuvastatin calcium) .Marland Kitchen... Take 1 tablet by mouth at bedtime  Problem # 2:  CORONARY ARTERY DISEASE (ICD-414.00) Continue aspirin, beta blocker and statin. Discontinue Plavix. The following medications were removed from the medication list:    Plavix 75 Mg Tabs (Clopidogrel bisulfate) .Marland Kitchen... Take 1 tablet by mouth once a day His updated medication list for this problem includes:    Aspirin 325 Mg Tabs (Aspirin) ..... Once daily    Metoprolol Tartrate 50 Mg Tabs (Metoprolol tartrate) .Marland Kitchen... Take one tablet by mouth twice a day    Amlodipine Besylate 10 Mg Tabs (Amlodipine besylate) .Marland Kitchen... Take one tablet by mouth daily  Problem # 3:  HYPERTENSION (ICD-401.9) Blood pressure elevated. Increase amlodipine to 10 mg p.o. daily. His updated medication list for this problem includes:    Aspirin 325 Mg Tabs (Aspirin) ..... Once daily    Metoprolol  Tartrate 50 Mg Tabs (Metoprolol tartrate) .Marland Kitchen... Take one tablet by mouth twice a day    Amlodipine Besylate 10 Mg Tabs (Amlodipine besylate) .Marland Kitchen... Take one tablet by mouth daily  Problem # 4:  DIABETES-TYPE 2 (ICD-250.00) Management per primary care. His updated medication list for this problem includes:    Aspirin 325 Mg Tabs (Aspirin) ..... Once daily  Problem # 5:  CEREBROVASCULAR ACCIDENT, HX OF (ICD-V12.50)  Problem # 6:  RENAL INSUFFICIENCY (ICD-588.9) Check potassium and renal function.  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   Signed by:   Deliah Goody, RN on 11/19/2009   Method used:   [facility]        [facility]  [address] [phone]* (retail)       [address]  [place], [place]  [address]       Ph: [phone] or [phone]       Fax: [phone]   RxID:   [id number]

## 2010-04-11 NOTE — Progress Notes (Signed)
Summary: gout flare prednisone & hydrocodone  Phone Note Call from Patient Call back at Work Phone [phone]   Caller: vm Summary of Call: Gout flare, 1st in 2 mo.  Need prednisone & hydrocodone.  Intense pain.  Need meds today.   [facility] [address] [place] [phone] Initial call taken by: Rudy Jew, RN,  November 02, 2009 9:32 AM  Follow-up for Phone Call        prednisone 20 mg by mouth once daily for 3 days vicodin 5/325 1 by mouth four times a day as needed #10 Follow-up by: Birdie Sons MD,  November 02, 2009 10:38 AM

## 2010-04-11 NOTE — Progress Notes (Signed)
Summary: [facility]  Phone Note Call from Patient   Caller: Patient Summary of Call: Pt adv that his Rx was never called into / [facility] - SEE EMR NOTE FROM 8/26...Marland Kitchen.. Pt would like Rx sent to Pharmacy ASAP, adv he called on Friday req same but adv it hasn't been taken care of....  I reassured pt that Rx instructions were given by Dr Cato Mulligan on Friday, 8/26 and that there may have been a "glitch" in the system that prevented it from going through.... Pt req that Rx be resent to Pharmacy today.  Initial call taken by: Debbra Riding,  November 06, 2009 11:56 AM  Follow-up for Phone Call        seeRx. Follow-up by: Gladis Riffle, RN,  November 06, 2009 3:02 PM    New/Updated Medications: PREDNISONE 20 MG TABS (PREDNISONE) Take 1 tablet by mouth once a day X3 days * VICODIN 5-325MG  TABLETS Take 1 tablet by mouth four times a day as needed Prescriptions: VICODIN 5-325MG  TABLETS Take 1 tablet by mouth four times a day as needed  #10 x 0   Entered by:   Gladis Riffle, RN   Authorized by:   Birdie Sons MD   Signed by:   Gladis Riffle, RN on 11/06/2009   Method used:   [facility]phoned to ...       [facility] [phone][facility]       [address] [place], [place]  [address]       Ph: [phone]       Fax: [phone]   RxID:   [id number] PREDNISONE 20 MG TABS (PREDNISONE) Take 1 tablet by mouth once a day X3 days  #10 x 0   Entered by:   Gladis Riffle, RN   Authorized by:   Birdie Sons MD   Signed by:   Gladis Riffle, RN on 11/06/2009   Method used:   Telephoned to ...       [facility] [phone][facility] ([facility]ail)       [address] [place], [place]  [address]       Ph: [phone]       Fax: [phone]   RxID:   [id number]

## 2010-04-11 NOTE — Assessment & Plan Note (Signed)
Summary: eph/per matt/jss   Visit Type:  Follow-up Primary Provider:  Dr Cato Mulligan  CC:  no cardiac complaints.  History of Present Illness: Pleasant gentleman recently admitted with a non-ST elevation myocardial infarction. Cardiac catheterization was performed on June 12, 2009. This revealed normal left main. The LAD had a 70% narrowing just before  the large diagonal branch and 70-80% narrowing just after the diagonal branch.  There was also 70-80% stenosis in the proximal portion of the first large diagonal branch. The second marginal branch had a 90% ostial stenosis.  There was also some 50% narrowing in the AV circumflex at that bifurcation. There was also 90% narrowing in the distal circumflex artery. The right coronary artery was a small to moderate sized vessel that gave rise to 2 right ventricular branches, a  posterior descending branch and then was completely occluded.  There was  a posterolateral branch which filled via collaterals from the posterior  branch and also filled via collaterals from the circumflex artery. There was 80% narrowing in the distal right coronary artery.  There were 95% ostial stenoses in both acute marginal branches. Note an echocardiogram showed normal LV function. A VQ scan was very low probability. The patient also has some renal insufficiency. He was seen by Dr. Cornelius Moras and CABG was recommended. However the patient preferred for this to be delayed. The risk of MI and death were discussed prior to discharge. Since he was discharged the patient denies any dyspnea on exertion, orthopnea, PND, pedal edema, palpitations, syncope or chest pain.   Preventive Screening-Counseling & Management  Alcohol-Tobacco     Smoking Status: never  Current Medications (verified): 1)  Lamictal 200 Mg Tabs (Lamotrigine) .... Take 1 Tablet By Mouth Once A Day 2)  Vicodin 5-500 Mg Tabs (Hydrocodone-Acetaminophen) .Marland Kitchen.. 1-2 By Mouth Once Daily As Needed 3)  Plavix 75 Mg Tabs (Clopidogrel  Bisulfate) .... Take 1 Tablet By Mouth Once A Day 4)  Crestor 40 Mg Tabs (Rosuvastatin Calcium) .... Take 1 Tablet By Mouth At Bedtime 5)  Lunesta 3 Mg Tabs (Eszopiclone) .... At Bedtime As Needed 6)  Diazepam 5 Mg Tabs (Diazepam) .... Take 1 Tab By Mouth At Bedtime As Needed Anxiety 7)  Minocycline Hcl 100 Mg Caps (Minocycline Hcl) .... Take 1 Capsule By Mouth Two Times A Day 8)  Aspirin 325 Mg Tabs (Aspirin) .... Once Daily 9)  Hydromet 5-1.5 Mg/14ml Syrp (Hydrocodone-Homatropine) .Marland Kitchen.. 1 Teaspoon Two Times A Day As Needed Cough 10)  Allopurinol 300 Mg Tabs (Allopurinol) .... Take 1/2 Tab Every Day 11)  Metoprolol Tartrate 50 Mg Tabs (Metoprolol Tartrate) .... Take One Tablet By Mouth Twice A Day  Allergies (verified): 1)  ! 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 child  Past Surgical History: Reviewed history from 06/20/2009 and no changes required. ankle arthroscopy plastic surgery chin liposuction deviated septum pancreatitis left ankle surgery.   Social History: Reviewed history from 05/05/2007 and no changes required. 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   Review of Systems       Problems with arthritis but no fevers or chills, productive cough, hemoptysis, dysphasia, odynophagia, melena, hematochezia, dysuria, hematuria, rash, seizure activity, orthopnea,  PND, pedal edema, claudication. Remaining systems are negative.   Vital  Signs:  Patient profile:   60 year old male Height:      72 inches Weight:      244 pounds BMI:     33.21 Pulse rate:   70 / minute BP sitting:   154 / 94  (left arm)  Vitals Entered By: Burnett Kanaris, CNA (June 26, 2009 10:19 AM)  Physical Exam  General:  Well-developed well-nourished in no acute distress.  Skin is warm and dry.  HEENT is normal.  Neck is supple. No thyromegaly.  Chest is clear to auscultation with normal expansion.  Cardiovascular exam is regular rate and rhythm.  Abdominal exam nontender or distended. No masses palpated. Right groin with no hematoma and no bruit. Extremities show no edema. neuro grossly intact    EKG  Procedure date:  06/26/2009  Findings:      Normal sinus rhythm at a rate of 69. Axis normal. Left ventricular hypertrophy.  Impression & Recommendations:  Problem # 1:  CORONARY ARTERY DISEASE (ICD-414.00) Continue aspirin, Plavix, statin and beta blocker. We again reviewed the risks of not proceeding with revascularization now. These include myocardial infarction and death. The patient has scheduled his coronary artery bypass and graft on August 09, 2009. He will discontinue his Plavix 10 days prior to the procedure. I've asked him to contact us with any chest pain or any other concerns. His updated medication list for this problem includes:    Plavix 75 Mg Tabs (Clopidogrel bisulfate) .Marland Kitchen... Take 1 tablet by mouth once a day    Aspirin 325 Mg Tabs (Aspirin) ..... Once daily    Metoprolol Tartrate 50 Mg Tabs (Metoprolol tartrate) .Marland Kitchen... Take one tablet by mouth twice a day  Problem # 2:  HYPERLIPIDEMIA (ICD-272.4) Continue statin. Check lipids and liver in 4 weeks. His updated medication list for this problem includes:    Crestor 40 Mg Tabs (Rosuvastatin calcium) .Marland Kitchen... Take 1 tablet by mouth at bedtime  Problem # 3:  HYPERTENSION (ICD-401.9) Continue Lopressor at 50 mg p.o. b.i.d. He states his blood pressure has been well-controlled on  the regimen. I've asked him to purchase a blood pressure cuff and keep track of his blood pressure at home. I will increase his medications if his systolic is over 161 one his diastolic greater than 85. His updated medication list for this problem includes:    Aspirin 325 Mg Tabs (Aspirin) ..... Once daily    Metoprolol Tartrate 50 Mg Tabs (Metoprolol tartrate) .Marland Kitchen... Take one tablet by mouth twice a day  Problem # 4:  CEREBROVASCULAR ACCIDENT, HX OF (ICD-V12.50)  Problem # 5:  DIABETES-TYPE 2 (ICD-250.00)  Management per primary care.  His updated medication list for this problem includes:    Aspirin 325 Mg Tabs (Aspirin) ..... Once daily  Problem # 6:  SLEEP APNEA (ICD-780.57)  Problem # 7:  RENAL INSUFFICIENCY (ICD-588.9) Management per nephrology.  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

## 2010-04-11 NOTE — Letter (Signed)
Summary: [facility]  [facility]   Imported By: Roderic Ovens 09/11/2009 09:22:21  _____________________________________________________________________  External Attachment:    Type:   Image     Comment:   External Document

## 2010-04-11 NOTE — Assessment & Plan Note (Signed)
Summary: chest congestion/njr   Vital Signs:  Patient profile:   60 year old male Temp:     98.0 degrees F oral BP sitting:   130 / 90  (left arm) Cuff size:   regular  Vitals Entered By: Sid Falcon LPN (February 12, 2010 9:43 AM)  History of Present Illness: Patient seen for the following  Onset about 4 days ago laryngitis, cough productive of green sputum and minimal nasal congestion. No fever. Using Mucinex with minimal improvement.  sportscaster and uses voice frequently.  2 days ago onset left wrist pain typical of gout flare. Possibly related to diet (shrimp consumption).  No wrist injury.  Has responded well to prednisone in the past. Avoids nonsteroidals secondary to history of some renal insufficiency.  Allergies: 1)  ! 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  Past Surgical History: Last updated: 11/19/2009 ankle arthroscopy plastic surgery chin liposuction deviated septum left ankle surgery Median sternotomy for coronary artery bypass grafting x5 (left internal mammary artery to distal left anterior descending coronary artery, saphenous vein graft to first diagonal branch, saphenous vein graft to second obtuse marginal branch with sequential saphenous vein graft to third obtuse marginal branch, saphenous vein graft to acute marginal branch of the right coronary artery, endoscopic saphenous vein harvest from right thigh and right lower leg) 07/25/09  Family History: Last updated: 05/05/2007 Family History Breast cancer  1st degree relative <50 Family History Depression Family History Diabetes 1st degree relative mother alive--breast ca, depression father deceased age 19--MI, DM 1 brother healthy 1 sister healthy  Social History: Last updated: 06/26/2009 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   Risk Factors: Exercise: no (12/30/2006)  Risk Factors: Smoking Status: never (06/26/2009) PMH-FH-SH reviewed for relevance  Physical Exam  General:  Well-developed,well-nourished,in no acute distress; alert,appropriate and cooperative throughout examination Ears:  External ear exam shows no significant lesions or deformities.  Otoscopic examination reveals clear canals, tympanic membranes are intact bilaterally without bulging, retraction, inflammation or discharge. Hearing is grossly normal bilaterally. Mouth:  Oral mucosa and oropharynx without lesions or exudates.  Teeth in good repair. Neck:  No deformities, masses, or tenderness noted. Lungs:  Normal respiratory effort, chest expands symmetrically. Lungs are clear to auscultation, no crackles or wheezes. Heart:  Normal rate and regular rhythm. S1 and S2 normal without gallop, murmur, click, rub or other extra sounds. Extremities:  left wrist is warm to touch and minimally swollen. No erythema.   No ecchymosis.   Impression & Recommendations:  Problem # 1:  GOUT (ICD-274.9) prednisone 20 mg two times a day for 5 days. His updated medication list for this problem includes:    Allopurinol 300 Mg Tabs (Allopurinol) .Marland Kitchen... Take 1 every day  Problem # 2:  ACUTE BRONCHITIS (ICD-466.0)  The following medications were removed from the medication list:    Minocycline Hcl 100 Mg Caps (Minocycline hcl) .Marland Kitchen... Take 1 capsule by mouth two times a day His updated medication list for this problem includes:    Azithromycin 250 Mg Tabs (Azithromycin) .Marland Kitchen... 2 by mouth today then one by mouth once daily for 4  days  Complete Medication List: 1)  Lamictal 200 Mg  Tabs (Lamotrigine) .... Take 1 tablet by mouth once a day 2)  Hydrocodone-acetaminophen 5-325 Mg Tabs (Hydrocodone-acetaminophen) .... One by mouth q 4 hours as needed pain 3)  Crestor 40 Mg Tabs (Rosuvastatin calcium) .... Take 1 tablet by mouth at bedtime 4)  Lunesta 3 Mg Tabs (Eszopiclone) .... At bedtime as needed 5)  Diazepam 5 Mg Tabs (Diazepam) .... Take 1 tab by mouth at bedtime as needed anxiety 6)  Aspirin 325 Mg Tabs (Aspirin) .... Once daily 7)  Allopurinol 300 Mg Tabs (Allopurinol) .... Take 1 every day 8)  Metoprolol Tartrate 50 Mg Tabs (Metoprolol tartrate) .... Take one tablet by mouth twice a day 9)  Amlodipine Besylate 10 Mg Tabs (Amlodipine besylate) .... Take one tablet by mouth daily 10)  Plavix 75 Mg Tabs (Clopidogrel bisulfate) .Marland Kitchen.. 1 once daily 11)  Prednisone 20 Mg Tabs (Prednisone) .... One by mouth two times a day for 5 days 12)  Azithromycin 250 Mg Tabs (Azithromycin) .... 2 by mouth today then one by mouth once daily for 4 days  Patient Instructions: 1)  Acute Bronchitis symptoms for less then 10 days are not  helped by antibiotics. Take over the counter cough medications. Call if no improvement in 5-7 days, sooner if increasing cough, fever, or new symptoms ( shortness of breath, chest pain) .  Prescriptions: PREDNISONE 20 MG TABS (PREDNISONE) one by mouth two times a day for 5 days  #10 x 0   Entered and Authorized by:   Evelena Peat MD   Signed by:   Evelena Peat MD on 02/12/2010   Method used:   [facility]        [facility] [address] [phone]* (retail)       [address]       [place], [place]  [address]       Ph: [phone] or [phone]       Fax: [phone]   RxID:   [id number] AZITHROMYCIN 250 MG TABS (AZITHROMYCIN) 2 by mouth today then one by mouth once daily for 4 days  #6 x 0   Entered and Authorized by:   Evelena Peat MD   Signed by:   Evelena Peat MD on 02/12/2010   Method used:   Print then Give to Patient   RxID:   [id number]    Orders Added: 1)  Est. Patient Level III [87564]

## 2010-04-12 NOTE — Miscellaneous (Signed)
Summary: [facility] [facility] Progress Report   [facility] Progress Report   Imported By: Roderic Ovens 09/13/2009 11:47:46  _____________________________________________________________________  External Attachment:    Type:   Image     Comment:   External Document

## 2010-04-17 ENCOUNTER — Encounter: Payer: Self-pay | Admitting: Family Medicine

## 2010-04-17 NOTE — Progress Notes (Signed)
Summary: Pt switched Physicians (From [facility] to [facility])  Phone Note Call from Patient   Caller: Patient Summary of Call: Pt adv that he woudl like to switch from  Dr Cato Mulligan  to  Dr Caryl Never due to availability.... Pt was adv process of both physicians being notifed of pt wanting to switch and that note would be sent for mutual agreement between physicians.... Ok for pt to switch?   Initial call taken by: Debbra Riding,  April 08, 2010 10:10 AM  Follow-up for Phone Call        ok with me Follow-up by: Evelena Peat MD,  April 08, 2010 10:46 AM  Additional Follow-up for Phone Call Additional follow up Details #1::        ok Additional Follow-up by: Birdie Sons MD,  April 08, 2010 11:25 AM

## 2010-04-17 NOTE — Progress Notes (Signed)
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 ..Marland KitchenMarland KitchenPrednisone was prescribed in the past and this worked well...Marland KitchenMarland Kitchen pt can't come in due to work and would like Rx sent to [facility] [employer].  Initial call taken by: Debbra Riding,  April 08, 2010 10:08 AM  Follow-up for Phone Call        Pt called to check on status of prednisone. Pls call in today.  Follow-up by: Lucy Antigua,  April 08, 2010 11:47 AM  Additional Follow-up for Phone Call Additional follow up Details #1::        Pt called in to ck on status of Rx... Advised to please call in asap.  Additional Follow-up by: Debbra Riding,  April 08, 2010 4:23 PM    Additional Follow-up for Phone Call Additional follow up Details #2::    refill Follow-up by: Evelena Peat MD,  April 08, 2010 5:52 PM  Additional Follow-up for Phone Call Additional follow up Details #3:: Details for Additional Follow-up Action Taken: Rx sent electronically, pt informed Additional Follow-up by: Sid Falcon LPN,  April 08, 2010 6:16 PM  New/Updated Medications: PREDNISONE 10 MG TABS (PREDNISONE) 3 tabs for 3 days, 2 tabs for 3 days, 1 tab for 3 days, 1/2 tab for 3 days Prescriptions: PREDNISONE 10 MG TABS (PREDNISONE) 3 tabs for 3 days, 2 tabs for 3 days, 1 tab for 3 days, 1/2 tab for 3 days  #20 x 0   Entered by:   Sid Falcon LPN   Authorized by:   Evelena Peat MD   Signed by:   Sid Falcon LPN on 40/98/1191   Method used:   [facility]        [facility] [address] [phone]* (retail)       [address]       [place], [place]  [address]       Ph: [phone] or [phone]       Fax: [phone]   RxID:   [id number]

## 2010-04-18 ENCOUNTER — Encounter: Payer: Self-pay | Admitting: Family Medicine

## 2010-04-18 ENCOUNTER — Ambulatory Visit (INDEPENDENT_AMBULATORY_CARE_PROVIDER_SITE_OTHER): Payer: BC Managed Care – PPO | Admitting: Family Medicine

## 2010-04-18 VITALS — BP 120/82 | Temp 97.8°F | Ht 71.0 in | Wt 255.0 lb

## 2010-04-18 DIAGNOSIS — I10 Essential (primary) hypertension: Secondary | ICD-10-CM

## 2010-04-18 DIAGNOSIS — J209 Acute bronchitis, unspecified: Secondary | ICD-10-CM

## 2010-04-18 DIAGNOSIS — R351 Nocturia: Secondary | ICD-10-CM

## 2010-04-18 MED ORDER — HYDROCODONE-HOMATROPINE 5-1.5 MG/5ML PO SYRP: 120.0000 mL | ORAL_SOLUTION | Freq: Four times a day (QID) | ORAL | Status: AC | PRN

## 2010-04-18 MED ORDER — BENZONATATE 200 MG PO CAPS: 200.0000 mg | ORAL_CAPSULE | Freq: Three times a day (TID) | ORAL | Status: AC | PRN

## 2010-04-18 NOTE — Patient Instructions (Signed)
Schedule complete physicial exam for later this year.

## 2010-04-18 NOTE — Progress Notes (Signed)
  Subjective:    Patient ID: [name], male    DOB: 09/17/1950, 60 y.o.   MRN: [id number]  HPI  Patient seen for the following   two-week history of cough. Mostly nonproductive. Symptoms initially started with upper respiratory symptoms. Exercising without difficulty. No associated fever. Denies any wheezing , GERD symptoms , postnasal drip symptoms. Nonsmoker. Tried over-the-counter cough medicines without improvement. No history of any dyspnea, hemoptysis, or pleuritic pain.    History of some urine frequency over the past several weeks. Nocturia sometimes up to 4 times per night. Previously on Flomax which helped. Minimal caffeine use. No alcohol use. Avoid excessive fluids at night. No obstructive urinary symptoms. At this point he is reluctant to add additional medication.  Hypertension and CAD hx . Compliant with all meds.   BP has been stable.  No chest pains.   Review of Systems  patient denies any fever, chills, chest pain, dyspnea with exertion , burning with urination No rash, arthralgia.  Some myalgias secondary to recent exercise.    Objective:   Physical Exam  patient is alert and in no distress  Oropharynx is clear   eardrums no acute change  Neck no adenopathy  chest clear to auscultation  Heart regular rhythm and rate  Extremities no edema Skin no rash       Assessment & Plan:   #1 cough probably secondary to acute viral bronchitis.  Tessalon Perles 200 mg every 8 hours as necessary and supplement with hydrocodone cough syrup as needed at night.  Prompt follow up for any fever or continued symptoms.  #2 nocturia. No obstructive urinary symptoms. Discussed avoidance of late day drinking. Patient wishes to observe for now. #3 hypertension-stable.

## 2010-05-27 LAB — POCT I-STAT, CHEM 8
BUN: 28 mg/dL — ABNORMAL HIGH (ref 6–23)
Calcium, Ion: 1.13 mmol/L (ref 1.12–1.32)
Chloride: 106 mEq/L (ref 96–112)
Chloride: 109 mEq/L (ref 96–112)
Creatinine, Ser: 2.2 mg/dL — ABNORMAL HIGH (ref 0.4–1.5)
Glucose, Bld: 146 mg/dL — ABNORMAL HIGH (ref 70–99)
HCT: 31 % — ABNORMAL LOW (ref 39.0–52.0)
Potassium: 5.2 mEq/L — ABNORMAL HIGH (ref 3.5–5.1)
TCO2: 22 mmol/L (ref 0–100)

## 2010-05-27 LAB — POCT I-STAT 4, (NA,K, GLUC, HGB,HCT)
Glucose, Bld: 101 mg/dL — ABNORMAL HIGH (ref 70–99)
Glucose, Bld: 126 mg/dL — ABNORMAL HIGH (ref 70–99)
Glucose, Bld: 135 mg/dL — ABNORMAL HIGH (ref 70–99)
Glucose, Bld: 147 mg/dL — ABNORMAL HIGH (ref 70–99)
HCT: 29 % — ABNORMAL LOW (ref 39.0–52.0)
HCT: 32 % — ABNORMAL LOW (ref 39.0–52.0)
Hemoglobin: 12.2 g/dL — ABNORMAL LOW (ref 13.0–17.0)
Hemoglobin: 8.8 g/dL — ABNORMAL LOW (ref 13.0–17.0)
Hemoglobin: 9.2 g/dL — ABNORMAL LOW (ref 13.0–17.0)
Potassium: 4.5 mEq/L (ref 3.5–5.1)
Potassium: 4.5 mEq/L (ref 3.5–5.1)
Sodium: 136 mEq/L (ref 135–145)
Sodium: 137 mEq/L (ref 135–145)
Sodium: 140 mEq/L (ref 135–145)

## 2010-05-27 LAB — BASIC METABOLIC PANEL
BUN: 26 mg/dL — ABNORMAL HIGH (ref 6–23)
BUN: 34 mg/dL — ABNORMAL HIGH (ref 6–23)
BUN: 35 mg/dL — ABNORMAL HIGH (ref 6–23)
BUN: 35 mg/dL — ABNORMAL HIGH (ref 6–23)
BUN: 38 mg/dL — ABNORMAL HIGH (ref 6–23)
CO2: 29 mEq/L (ref 19–32)
CO2: 31 mEq/L (ref 19–32)
CO2: 33 mEq/L — ABNORMAL HIGH (ref 19–32)
Calcium: 8.3 mg/dL — ABNORMAL LOW (ref 8.4–10.5)
Calcium: 8.4 mg/dL (ref 8.4–10.5)
Calcium: 8.4 mg/dL (ref 8.4–10.5)
Calcium: 8.9 mg/dL (ref 8.4–10.5)
Chloride: 107 mEq/L (ref 96–112)
Chloride: 98 mEq/L (ref 96–112)
Creatinine, Ser: 3.21 mg/dL — ABNORMAL HIGH (ref 0.4–1.5)
Creatinine, Ser: 3.34 mg/dL — ABNORMAL HIGH (ref 0.4–1.5)
GFR calc Af Amer: 23 mL/min — ABNORMAL LOW (ref >60)
GFR calc non Af Amer: 18 mL/min — ABNORMAL LOW (ref >60)
GFR calc non Af Amer: 19 mL/min — ABNORMAL LOW (ref >60)
GFR calc non Af Amer: 20 mL/min — ABNORMAL LOW (ref >60)
GFR calc non Af Amer: 25 mL/min — ABNORMAL LOW (ref >60)
GFR calc non Af Amer: 27 mL/min — ABNORMAL LOW (ref >60)
Glucose, Bld: 119 mg/dL — ABNORMAL HIGH (ref 70–99)
Glucose, Bld: 122 mg/dL — ABNORMAL HIGH (ref 70–99)
Glucose, Bld: 139 mg/dL — ABNORMAL HIGH (ref 70–99)
Glucose, Bld: 150 mg/dL — ABNORMAL HIGH (ref 70–99)
Glucose, Bld: 86 mg/dL (ref 70–99)
Glucose, Bld: 96 mg/dL (ref 70–99)
Potassium: 3.1 mEq/L — ABNORMAL LOW (ref 3.5–5.1)
Potassium: 5.1 mEq/L (ref 3.5–5.1)
Sodium: 136 mEq/L (ref 135–145)
Sodium: 137 mEq/L (ref 135–145)
Sodium: 137 mEq/L (ref 135–145)
Sodium: 139 mEq/L (ref 135–145)
Sodium: 140 mEq/L (ref 135–145)

## 2010-05-27 LAB — POCT I-STAT 3, ART BLOOD GAS (G3+)
Acid-base deficit: 3 mmol/L — ABNORMAL HIGH (ref 0.0–2.0)
Acid-base deficit: 3 mmol/L — ABNORMAL HIGH (ref 0.0–2.0)
Bicarbonate: 23.6 mEq/L (ref 20.0–24.0)
O2 Saturation: 100 %
O2 Saturation: 91 %
O2 Saturation: 91 %
O2 Saturation: 95 %
Patient temperature: 36.4
Patient temperature: 36.4
Patient temperature: 37.1
TCO2: 25 mmol/L (ref 0–100)
pCO2 arterial: 43.6 mmHg (ref 35.0–45.0)
pCO2 arterial: 44.1 mmHg (ref 35.0–45.0)
pH, Arterial: 7.304 — ABNORMAL LOW (ref 7.350–7.450)
pH, Arterial: 7.334 — ABNORMAL LOW (ref 7.350–7.450)
pO2, Arterial: 158 mmHg — ABNORMAL HIGH (ref 80.0–100.0)
pO2, Arterial: 282 mmHg — ABNORMAL HIGH (ref 80.0–100.0)

## 2010-05-27 LAB — GLUCOSE, CAPILLARY
Glucose-Capillary: 110 mg/dL — ABNORMAL HIGH (ref 70–99)
Glucose-Capillary: 112 mg/dL — ABNORMAL HIGH (ref 70–99)
Glucose-Capillary: 113 mg/dL — ABNORMAL HIGH (ref 70–99)
Glucose-Capillary: 114 mg/dL — ABNORMAL HIGH (ref 70–99)
Glucose-Capillary: 115 mg/dL — ABNORMAL HIGH (ref 70–99)
Glucose-Capillary: 116 mg/dL — ABNORMAL HIGH (ref 70–99)
Glucose-Capillary: 123 mg/dL — ABNORMAL HIGH (ref 70–99)
Glucose-Capillary: 124 mg/dL — ABNORMAL HIGH (ref 70–99)
Glucose-Capillary: 125 mg/dL — ABNORMAL HIGH (ref 70–99)
Glucose-Capillary: 137 mg/dL — ABNORMAL HIGH (ref 70–99)
Glucose-Capillary: 141 mg/dL — ABNORMAL HIGH (ref 70–99)
Glucose-Capillary: 143 mg/dL — ABNORMAL HIGH (ref 70–99)
Glucose-Capillary: 146 mg/dL — ABNORMAL HIGH (ref 70–99)
Glucose-Capillary: 147 mg/dL — ABNORMAL HIGH (ref 70–99)
Glucose-Capillary: 150 mg/dL — ABNORMAL HIGH (ref 70–99)
Glucose-Capillary: 95 mg/dL (ref 70–99)

## 2010-05-27 LAB — CBC
HCT: 29.8 % — ABNORMAL LOW (ref 39.0–52.0)
HCT: 33.3 % — ABNORMAL LOW (ref 39.0–52.0)
Hemoglobin: 8.5 g/dL — ABNORMAL LOW (ref 13.0–17.0)
Hemoglobin: 9.7 g/dL — ABNORMAL LOW (ref 13.0–17.0)
MCHC: 32.6 g/dL (ref 30.0–36.0)
MCHC: 32.8 g/dL (ref 30.0–36.0)
MCHC: 32.9 g/dL (ref 30.0–36.0)
MCHC: 33.1 g/dL (ref 30.0–36.0)
MCV: 82 fL (ref 78.0–100.0)
MCV: 82.8 fL (ref 78.0–100.0)
MCV: 83 fL (ref 78.0–100.0)
MCV: 83.3 fL (ref 78.0–100.0)
Platelets: 162 10*3/uL (ref 150–400)
Platelets: 165 10*3/uL (ref 150–400)
Platelets: 191 10*3/uL (ref 150–400)
Platelets: 198 10*3/uL (ref 150–400)
Platelets: 205 10*3/uL (ref 150–400)
Platelets: 287 10*3/uL (ref 150–400)
RBC: 3.61 MIL/uL — ABNORMAL LOW (ref 4.22–5.81)
RDW: 14.5 % (ref 11.5–15.5)
RDW: 14.6 % (ref 11.5–15.5)
RDW: 14.6 % (ref 11.5–15.5)
RDW: 14.7 % (ref 11.5–15.5)
RDW: 14.8 % (ref 11.5–15.5)
WBC: 12.5 10*3/uL — ABNORMAL HIGH (ref 4.0–10.5)
WBC: 15.1 10*3/uL — ABNORMAL HIGH (ref 4.0–10.5)
WBC: 20.5 10*3/uL — ABNORMAL HIGH (ref 4.0–10.5)

## 2010-05-27 LAB — URINALYSIS, ROUTINE W REFLEX MICROSCOPIC
Hgb urine dipstick: NEGATIVE
Nitrite: NEGATIVE
Protein, ur: NEGATIVE mg/dL
Urobilinogen, UA: 0.2 mg/dL (ref 0.0–1.0)

## 2010-05-27 LAB — SURGICAL PCR SCREEN: Staphylococcus aureus: POSITIVE — AB

## 2010-05-27 LAB — PROTIME-INR
INR: 1.01 (ref 0.00–1.49)
INR: 1.26 (ref 0.00–1.49)
Prothrombin Time: 13.2 seconds (ref 11.6–15.2)
Prothrombin Time: 15.7 seconds — ABNORMAL HIGH (ref 11.6–15.2)

## 2010-05-27 LAB — CREATININE, SERUM
GFR calc Af Amer: 36 mL/min — ABNORMAL LOW (ref >60)
GFR calc non Af Amer: 22 mL/min — ABNORMAL LOW (ref >60)

## 2010-05-27 LAB — HEMOGLOBIN AND HEMATOCRIT, BLOOD: Hemoglobin: 8.8 g/dL — ABNORMAL LOW (ref 13.0–17.0)

## 2010-05-27 LAB — BLOOD GAS, ARTERIAL
Acid-base deficit: 0.9 mmol/L (ref 0.0–2.0)
Drawn by: 181601
FIO2: 0.21 %
O2 Saturation: 95.7 %
pCO2 arterial: 39.6 mmHg (ref 35.0–45.0)

## 2010-05-27 LAB — MAGNESIUM: Magnesium: 3.2 mg/dL — ABNORMAL HIGH (ref 1.5–2.5)

## 2010-05-27 LAB — COMPREHENSIVE METABOLIC PANEL
AST: 15 U/L (ref 0–37)
Albumin: 3.8 g/dL (ref 3.5–5.2)
Calcium: 9.3 mg/dL (ref 8.4–10.5)
Creatinine, Ser: 2.35 mg/dL — ABNORMAL HIGH (ref 0.4–1.5)
GFR calc Af Amer: 35 mL/min — ABNORMAL LOW (ref >60)
Sodium: 140 mEq/L (ref 135–145)
Total Protein: 7.1 g/dL (ref 6.0–8.3)

## 2010-05-27 LAB — TYPE AND SCREEN

## 2010-05-27 LAB — APTT: aPTT: 29 seconds (ref 24–37)

## 2010-05-29 LAB — POCT I-STAT, CHEM 8
Creatinine, Ser: 2.2 mg/dL — ABNORMAL HIGH (ref 0.4–1.5)
Glucose, Bld: 160 mg/dL — ABNORMAL HIGH (ref 70–99)
HCT: 41 % (ref 39.0–52.0)
Hemoglobin: 13.9 g/dL (ref 13.0–17.0)
Sodium: 140 mEq/L (ref 135–145)
TCO2: 22 mmol/L (ref 0–100)

## 2010-05-29 LAB — HEPARIN LEVEL (UNFRACTIONATED)
Heparin Unfractionated: 0.27 IU/mL — ABNORMAL LOW (ref 0.30–0.70)
Heparin Unfractionated: <0.1 IU/mL — ABNORMAL LOW (ref 0.30–0.70)

## 2010-05-29 LAB — CK TOTAL AND CKMB (NOT AT ARMC)
CK, MB: 3.7 ng/mL (ref 0.3–4.0)
Total CK: 96 U/L (ref 7–232)

## 2010-05-29 LAB — DIFFERENTIAL
Basophils Absolute: 0.1 10*3/uL (ref 0.0–0.1)
Eosinophils Relative: 3 % (ref 0–5)
Lymphocytes Relative: 21 % (ref 12–46)
Neutrophils Relative %: 68 % (ref 43–77)

## 2010-05-29 LAB — LIPID PANEL
LDL Cholesterol: 110 mg/dL — ABNORMAL HIGH (ref 0–99)
VLDL: 34 mg/dL (ref 0–40)

## 2010-05-29 LAB — CBC
HCT: 35.6 % — ABNORMAL LOW (ref 39.0–52.0)
HCT: 36.1 % — ABNORMAL LOW (ref 39.0–52.0)
HCT: 39.1 % (ref 39.0–52.0)
MCV: 83.4 fL (ref 78.0–100.0)
Platelets: 279 10*3/uL (ref 150–400)
Platelets: 280 10*3/uL (ref 150–400)
RBC: 4.32 MIL/uL (ref 4.22–5.81)
RDW: 14.6 % (ref 11.5–15.5)
RDW: 14.6 % (ref 11.5–15.5)
WBC: 16.3 10*3/uL — ABNORMAL HIGH (ref 4.0–10.5)

## 2010-05-29 LAB — URINALYSIS, ROUTINE W REFLEX MICROSCOPIC
Bilirubin Urine: NEGATIVE
Glucose, UA: NEGATIVE mg/dL
Hgb urine dipstick: NEGATIVE
Ketones, ur: NEGATIVE mg/dL
Protein, ur: NEGATIVE mg/dL

## 2010-05-29 LAB — FERRITIN: Ferritin: 224 ng/mL (ref 22–322)

## 2010-05-29 LAB — PROTIME-INR
INR: 0.93 (ref 0.00–1.49)
Prothrombin Time: 12.4 seconds (ref 11.6–15.2)

## 2010-05-29 LAB — MAGNESIUM: Magnesium: 2.2 mg/dL (ref 1.5–2.5)

## 2010-05-29 LAB — GLUCOSE, CAPILLARY
Glucose-Capillary: 131 mg/dL — ABNORMAL HIGH (ref 70–99)
Glucose-Capillary: 148 mg/dL — ABNORMAL HIGH (ref 70–99)
Glucose-Capillary: 183 mg/dL — ABNORMAL HIGH (ref 70–99)

## 2010-05-29 LAB — BASIC METABOLIC PANEL
BUN: 35 mg/dL — ABNORMAL HIGH (ref 6–23)
Chloride: 109 mEq/L (ref 96–112)
Creatinine, Ser: 2.39 mg/dL — ABNORMAL HIGH (ref 0.4–1.5)
GFR calc Af Amer: 34 mL/min — ABNORMAL LOW (ref >60)
GFR calc non Af Amer: 28 mL/min — ABNORMAL LOW (ref >60)
GFR calc non Af Amer: 30 mL/min — ABNORMAL LOW (ref >60)
Glucose, Bld: 117 mg/dL — ABNORMAL HIGH (ref 70–99)
Potassium: 4.2 mEq/L (ref 3.5–5.1)
Potassium: 4.5 mEq/L (ref 3.5–5.1)

## 2010-05-29 LAB — UIFE/LIGHT CHAINS/TP QN, 24-HR UR
Free Kappa Lt Chains,Ur: 3.42 mg/dL — ABNORMAL HIGH (ref 0.04–1.51)
Free Lambda Excretion/Day: 18.81 mg/d
Free Lambda Lt Chains,Ur: 0.66 mg/dL (ref 0.08–1.01)
Gamma Globulin, Urine: DETECTED — AB
Time: 24 hours
Total Protein, Urine-Ur/day: 148 mg/d — ABNORMAL HIGH (ref 10–140)

## 2010-05-29 LAB — CREATININE, URINE, 24 HOUR: Creatinine, 24H Ur: 1573 mg/d (ref 800–2000)

## 2010-05-29 LAB — CARDIAC PANEL(CRET KIN+CKTOT+MB+TROPI)
CK, MB: 11.6 ng/mL (ref 0.3–4.0)
CK, MB: 8.2 ng/mL (ref 0.3–4.0)
CK, MB: 9.1 ng/mL (ref 0.3–4.0)
Relative Index: 10.6 — ABNORMAL HIGH (ref 0.0–2.5)
Relative Index: 7.3 — ABNORMAL HIGH (ref 0.0–2.5)
Relative Index: INVALID (ref 0.0–2.5)
Total CK: 114 U/L (ref 7–232)
Total CK: 143 U/L (ref 7–232)
Troponin I: 1.16 ng/mL (ref 0.00–0.06)
Troponin I: 1.56 ng/mL (ref 0.00–0.06)
Troponin I: 1.73 ng/mL (ref 0.00–0.06)

## 2010-05-29 LAB — IRON AND TIBC: UIBC: 285 ug/dL

## 2010-05-29 LAB — TROPONIN I: Troponin I: 0.24 ng/mL — ABNORMAL HIGH (ref 0.00–0.06)

## 2010-05-29 LAB — POCT CARDIAC MARKERS: Myoglobin, poc: 61.8 ng/mL (ref 12–200)

## 2010-05-29 LAB — PROTEIN, URINE, RANDOM: Total Protein, Urine: 11 mg/dL

## 2010-07-03 ENCOUNTER — Telehealth: Payer: Self-pay | Admitting: Cardiology

## 2010-07-03 DIAGNOSIS — E7800 Pure hypercholesterolemia, unspecified: Secondary | ICD-10-CM

## 2010-07-03 DIAGNOSIS — Z79899 Other long term (current) drug therapy: Secondary | ICD-10-CM

## 2010-07-03 NOTE — Telephone Encounter (Signed)
Pt wants to come for blood work BEFORE HIS APPT WITH [facility] ON MAY 2.

## 2010-07-03 NOTE — Telephone Encounter (Signed)
Spoke with pt, he would like to have lab work tomorrow, orders placed [facility]

## 2010-07-04 ENCOUNTER — Other Ambulatory Visit (INDEPENDENT_AMBULATORY_CARE_PROVIDER_SITE_OTHER): Payer: BC Managed Care – PPO | Admitting: *Deleted

## 2010-07-04 DIAGNOSIS — E7800 Pure hypercholesterolemia, unspecified: Secondary | ICD-10-CM

## 2010-07-04 DIAGNOSIS — Z79899 Other long term (current) drug therapy: Secondary | ICD-10-CM

## 2010-07-04 LAB — LIPID PANEL
Cholesterol: 160 mg/dL (ref 0–200)
HDL: 45.8 mg/dL (ref >39.00)
Total CHOL/HDL Ratio: 3
Triglycerides: 193 mg/dL — ABNORMAL HIGH (ref 0.0–149.0)

## 2010-07-04 LAB — BASIC METABOLIC PANEL
CO2: 25 mEq/L (ref 19–32)
Calcium: 9.1 mg/dL (ref 8.4–10.5)
Creatinine, Ser: 2.3 mg/dL — ABNORMAL HIGH (ref 0.4–1.5)
GFR: 31.24 mL/min — ABNORMAL LOW (ref >60.00)
Sodium: 140 mEq/L (ref 135–145)

## 2010-07-04 LAB — HEPATIC FUNCTION PANEL
AST: 24 U/L (ref 0–37)
Albumin: 4.1 g/dL (ref 3.5–5.2)
Alkaline Phosphatase: 130 U/L — ABNORMAL HIGH (ref 39–117)
Total Protein: 7 g/dL (ref 6.0–8.3)

## 2010-07-10 ENCOUNTER — Ambulatory Visit (INDEPENDENT_AMBULATORY_CARE_PROVIDER_SITE_OTHER): Payer: BC Managed Care – PPO | Admitting: Cardiology

## 2010-07-10 ENCOUNTER — Encounter: Payer: Self-pay | Admitting: Cardiology

## 2010-07-10 DIAGNOSIS — I2510 Atherosclerotic heart disease of native coronary artery without angina pectoris: Secondary | ICD-10-CM

## 2010-07-10 DIAGNOSIS — I10 Essential (primary) hypertension: Secondary | ICD-10-CM

## 2010-07-10 NOTE — Assessment & Plan Note (Signed)
Continue aspirin and statin. Continue risk factor modification. 

## 2010-07-10 NOTE — Assessment & Plan Note (Signed)
Blood pressure mildly elevated. However he states normal at home. We will follow this and increase medications as needed.

## 2010-07-10 NOTE — Assessment & Plan Note (Signed)
Encouraged to fu with primary care concerning this issue.

## 2010-07-10 NOTE — Assessment & Plan Note (Signed)
Continue statin. Recent alkaline phosphatase mildly elevated. Most likely secondary to renal insufficiency. Check GGT and 5' nucleotidase.

## 2010-07-10 NOTE — Patient Instructions (Signed)
Your physician recommends that you schedule a follow-up appointment in:12 months with Dr. Jens Som

## 2010-07-10 NOTE — Assessment & Plan Note (Signed)
Referred to nephrology for long-term followup.

## 2010-07-10 NOTE — Progress Notes (Signed)
HPI: Pleasant gentleman for f/u CAD. Cardiac catheterization was performed on June 12, 2009 following NSTEMI. This revealed normal left main. The LAD had a 70% narrowing just before  the large diagonal branch and 70-80% narrowing just after the diagonal branch.  There was also 70-80% stenosis in the proximal portion of the first large diagonal branch. The second marginal branch had a 90% ostial stenosis.  There was also some 50% narrowing in the AV circumflex at that bifurcation. There was also 90% narrowing in the distal circumflex artery. The right coronary artery was a small to moderate sized vessel that gave rise to 2 right ventricular branches, a  posterior descending branch and then was completely occluded.  There was  a posterolateral branch which filled via collaterals from the posterior  branch and also filled via collaterals from the circumflex artery. There was 80% narrowing in the distal right coronary artery.  There were 95% ostial stenoses in both acute marginal branches. Note an echocardiogram showed normal LV function. Patient underwent coronary artery bypass and graft on Jul 25, 2009. I last saw him in Sept 2011. Since then the patient has dyspnea with more extreme activities but not with routine activities. It is relieved with rest. It is not associated with chest pain. There is no orthopnea, PND or pedal edema. There is no syncope or palpitations. There is no exertional chest pain.  Current Outpatient Prescriptions  Medication Sig Dispense Refill  . allopurinol (ZYLOPRIM) 300 MG tablet Take 300 mg by mouth daily.        Marland Kitchen amLODipine (NORVASC) 10 MG tablet Take 10 mg by mouth daily.        Marland Kitchen aspirin 325 MG tablet Take 325 mg by mouth daily.        . clopidogrel (PLAVIX) 75 MG tablet Take 75 mg by mouth daily.        . diazepam (VALIUM) 5 MG tablet Take 5 mg by mouth at bedtime as needed. For anxiety       . Eszopiclone (ESZOPICLONE) 3 MG TABS Take 3 mg by mouth at bedtime as needed. Take  immediately before bedtime       . lamoTRIgine (LAMICTAL) 200 MG tablet Take 200 mg by mouth daily.       . metoprolol (TOPROL-XL) 50 MG 24 hr tablet Take 50 mg by mouth daily.       . rosuvastatin (CRESTOR) 40 MG tablet Take 40 mg by mouth at bedtime.        Marland Kitchen DISCONTD: HYDROcodone-acetaminophen (NORCO) 5-325 MG per tablet Take 1 tablet by mouth every 4 (four) hours as needed. For pain          Past Medical History  Diagnosis Date  . Hyperlipidemia   . CAD (coronary artery disease)   . Hypertension   . Homocystinemia   . Obesity   . Diabetes mellitus     type II  . Sleep apnea   . Benign prostatic hypertrophy   . Bipolar affective disorder, mixed, moderate   . Renal insufficiency   . Nephrolithiasis   . Gout   . 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 Ankle arthroscopy   . Cosmetic surgery     chin  . Liposuction   . Nasal septum surgery   . Ankle surgery     Left  . Median sternotomy 07/25/2009    History   Social History  . Marital Status: Married  Spouse Name: N/A    Number of Children: N/A  . Years of Education: N/A   Occupational History  . Not on file.   Social History Main Topics  . Smoking status: Never Smoker   . Smokeless tobacco: Not on file  . Alcohol Use: Not on file  . Drug Use: Not on file  . Sexually Active: Not on file   Other Topics Concern  . Not on file   Social History Narrative  . No narrative on file    ROS: Problems with depression but no fevers or chills, productive cough, hemoptysis, dysphasia, odynophagia, melena, hematochezia, dysuria, hematuria, rash, seizure activity, orthopnea, PND, pedal edema, claudication. Remaining systems are negative.  Physical Exam: Well-developed well-nourished in no acute distress.  Skin is warm and dry.  HEENT is normal.  Neck is supple. No thyromegaly.  Chest is clear to auscultation with normal expansion.  Cardiovascular exam is regular rate  and rhythm.  Abdominal exam nontender or distended. No masses palpated. Extremities show no edema. neuro grossly intact  ECG Sinus rhythm with first degree AV block. Left ventricular hypertrophy. RV conduction delay.

## 2010-07-11 LAB — GAMMA GT: GGT: 57 U/L — ABNORMAL HIGH (ref 7–51)

## 2010-07-13 LAB — NUCLEOTIDASE, 5', BLOOD: 5-Nucleotidase: 5 U/L (ref <11)

## 2010-07-15 ENCOUNTER — Telehealth: Payer: Self-pay | Admitting: *Deleted

## 2010-07-15 DIAGNOSIS — E7800 Pure hypercholesterolemia, unspecified: Secondary | ICD-10-CM

## 2010-07-15 NOTE — Telephone Encounter (Signed)
Message copied by Deliah Goody on Mon Jul 15, 2010 12:31 PM ------      Message from: Olga Millers      Created: Thu Jul 11, 2010  3:00 PM       Liver ultrasound      Olga Millers

## 2010-07-16 ENCOUNTER — Ambulatory Visit
Admission: RE | Admit: 2010-07-16 | Discharge: 2010-07-16 | Disposition: A | Discharge status: Home / Self Care | Payer: BC Managed Care – PPO | Source: Ambulatory Visit | Attending: Cardiology | Admitting: Cardiology

## 2010-07-16 DIAGNOSIS — E7800 Pure hypercholesterolemia, unspecified: Secondary | ICD-10-CM

## 2010-07-23 NOTE — H&P (Signed)
HISTORY AND PHYSICAL EXAMINATION   Jul 23, 2009   Re:  Devin [name], Devin [name]            DOB:  Jan 17, 1951   Date of planned hospital admission and surgery is Jul 25, 2009.   PRESENTING CHIEF COMPLAINT:  Chest pain.   HISTORY OF PRESENT ILLNESS:  The patient is [name] 60 year old gentleman with  no previous history of coronary artery disease, but risk factors notable  for history of hypertension, cerebrovascular disease, and obesity.  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  He  completely recovered from the stroke.  He also has history of  hypertension and hyperlipidemia and he remains physically active.  He  was admitted to the hospital on June 11, 2009 with unstable angina.  He  ruled in for mild non-ST-segment elevation myocardial infarction based  on serial cardiac enzymes.  Cardiac catheterization revealed the  presence of severe three-vessel coronary artery disease with preserved  left ventricular function noted on 2-D echocardiogram.  I had the  opportunity to see the patient in consultation on June 13, 2009 and [name]  full consultation report, history and physical exam were dictated at  that time.  He insisted on going home for period of time before  proceeding with surgery.  He has continued to have episodes of  exertional angina ever since, although he knows that he has never had  any episodes of pain quite as bad as the episode that caused him to come  to the hospital on June 11, 2009.  He has also noted some exertional  shortness of breath and fatigue.  He denies any episodes of prolonged  chest pain and he has not used any sublingual nitroglycerin.  The  remainder of his review of systems is unchanged from previously.   PAST MEDICAL HISTORY:  1. Cerebrovascular disease.  2. Hypertension.  3. Hyperlipidemia  4. Obesity.  5. Gout.  6. Bipolar disorder.  7.  Insomnia.  8. Homocystinemia.  9. Elevated white blood count.  10.Chronic renal insufficiency.   CURRENT MEDICATIONS:  1. Simvastatin 40 mg daily.  2. Lunesta 3 mg as needed for sleep.  3. Plavix 75 mg daily (quit Jul 18, 2009).  4. Prednisone as needed for gout flare (none recently).  5. Lamotrigine 200 mg daily.  6. Metoprolol 50 mg twice daily.  7. Aspirin 325 mg daily.  8. Iron sulfate 325 mg twice daily.  9. Allopurinol 300 mg daily.   DRUG ALLERGIES:  Demerol.   PHYSICAL EXAMINATION:  The patient is [name] well-appearing, mildly obese  male with blood pressure 153/100, pulse 78, and oxygen saturation 94% on  room air.  HEENT exam is unrevealing.  The neck is supple.  There is no  cervical nor supraclavicular lymphadenopathy.  There is no jugular  venous distention.  No carotid bruits are noted.  Auscultation of the  chest reveals clear breath sounds which are symmetrical bilaterally.  No  wheezes, rales, or rhonchi are noted.  Cardiovascular exam is notable  for regular rate and rhythm.  No murmurs, rubs, or gallops are  appreciated.  The abdomen is soft, mildly obese, and nontender.  Bowel  sounds are present.  There are no palpable masses.  The extremities are  warm and well perfused.  There is no lower extremity edema.  Distal  pulses are not palpable either on lower leg at the ankle.  Rectal and GU  exams are both deferred.  Neurologic examination is grossly nonfocal and  symmetrical throughout.   IMPRESSION:  Severe three-vessel coronary artery disease with preserved  left ventricular function.  The patient's risks of surgery will be  mildly increased due to his history of cerebrovascular disease and mild  chronic renal insufficiency.  He also has diffuse coronary artery  disease with relatively poor target vessels for grafting.  Left  ventricular function is preserved.   PLAN:  I have again reviewed the indications, risks, and potential  benefits of surgery with the  patient and his wife.  Alternative  treatment strategies have been discussed.  They understand and accept  all potential associated risks including, but not limited to risk of  death, stroke, myocardial infarction, congestive heart failure,  respiratory failure, pneumonia, bleeding requiring blood transfusion,  arrhythmia, heart block with bradycardia requiring permanent pacemaker,  late  recurrence of coronary artery disease or congestive heart failure.  All  of his questions have been addressed.  We spent in excess of 60 minutes  discussing matters here in the office today.   Salvatore Decent. Cornelius Moras, M.D.  Electronically Signed   CHO/MEDQ  D:  07/23/2009  T:  07/24/2009  Job:  [id number]   cc:   Everardo Beals. Juanda Chance, MD, [facility]  Madolyn Frieze. Jens Som, MD, [facility]

## 2010-07-23 NOTE — H&P (Signed)
NAMEGABREIL, YONKERS            ACCOUNT NO.:  [id number]   MEDICAL RECORD NO.:  [id number]          PATIENT TYPE:  INP   LOCATION:  3002                         FACILITY:  [facility]   PHYSICIAN:  Levert Feinstein, MD          DATE OF BIRTH:  May 16, 1950   DATE OF ADMISSION:  10/08/2007  DATE OF DISCHARGE:                              HISTORY & PHYSICAL   CHIEF COMPLAINT:  Vertigo.   HISTORY OF PRESENT ILLNESS:  The patient is a 60 year old gentleman,  accompanied by his wife at today's clinic visitl. It is an urgent  consult from ENT doctor, Dr. Pollyann Kennedy. He had past medical history of  obesity, hypertension, hyperlipidemia, presenting with acute onset of  intense vertigo last Thursday, 8 days ago, September 30, 2007.   He was driving in the morning to tennis court, he suddenly felt intense  vertigo, things were spinning, he had nausea and vomiting.  He had to  stop the car, when he stepped out he felt unbalance, but the symptoms  gradually improved to some degree, he was able to drive back home.  On  his way home, he had developed this severe left retro-orbital intense  headache, which last about 2 hours, the headache gradually resolved  after he took a nap afterwards.  However, since the acute onset of  vertigo, he is basically lying on his right side on the sofa for the  last 8 days.  He would develop nausea and vomiting, and the return of  the dizziness sensation and also left retroorbital headache with sudden  positional change, especially when lean toward the left side.  In  addition, he complains of severe unsteady gait, he cannot keep his  balance, fell multiple times.  He has vomitted multiple times except  today and he was able to keep some food down after Phenergan  suppositories.  In addition, his headache is much improved after he took  Excedrin Migraine this morning, but he continued to complain left facial  numbness, heaviness sensation, and has unsteady gait, and came in  wheelchair.   He began to experience some symptoms since October 2008, the first  episode was in October 2008, while driving at [place], he suddenly felt  dizziness.  He had tried to pull off his car at the roadside.  He  experienced intense wild spinning episode, lasted about 2 minutes and  he did have nausea, but there was no vomiting, no headache.  The vertigo  only lasted about 2 minutes, gradually subsided.  He was able to carry  on his driving.  However, since that episode, he has suffered about 8  more similar episodes over the following 3 months' period of time, which  has lead him to his MRI study at [facility] in November 2008.  I  personally reviewed the film, it was not with contrast, there is no  acute changes.  In specific, there is no brainstem or cerebellum lesions  identified.   In January 2009, he experienced double vision.  This happened again  while driving, suddenly things doulbed, horizontal.  He was able  to test  by closing one eye and confirmed it is binocular horizontal diplopia,  lasted about 5 to 10 minutes.  There was no associated nausea, vomiting,  or headache and symptoms gradually subsided.  In the following 4 months,  he began to experience similar horizontal diplopia, couple of times a  week, but all short lasting, and he was able to carry on his daily  activity without much limitation.   In addition, he began to have this frequent left retro-orbital headache,  sometimes seeing flashlight, severe pressure 8 out of 10 with associated  nausea and lightheadedness sensation.   REVIEW OF SYSTEMS:  He complains of weight loss, snoring, unbalanced  gait, numbness on his face, trouble walking, headache, dizziness, and  joint pain.   PAST MEDICAL HISTORY:  1. Hypertension.  2. Hyperlipidemia.  3. Bipolar disorder.   SURGICAL HISTORY:  Arthroscopic surgery of left ankle.   CURRENT MEDICATIONS:  1. Amlodipine 10 mg every day.  2. Lisinopril 20  mg every day.  3. Lamotrigine 200 mg once every day.  4. Clonazepam 5 mg as needed.  5. Lunesta 3 mg as needed.  6. Diazepam 10 mg 4 times every day.  7. Hydrocodone 7.5 mg.   MEDICATION ALLERGY:  No known drug allergies.   SOCIAL HISTORY:  He is married, lives with wife, has one daughter and  one son.  Drinks alcohol occasionally.  He has irregular sleep patterns.  He is a sports Psychologist, educational on television.   FAMILY HISTORY:  Father suffered heart disease and diabetes, and mother  has breast cancer, hypertension, thyroid problem, and migraines.   PHYSICAL EXAMINATION:  VITAL SIGNS:  Blood pressure 140/90, heart rate  of 84, and temperature of 98.6.  CARDIAC:  Regular rate and rhythm.  PULMONARY:  Clear to auscultation bilaterally.  NECK:  Supple.  No carotid bruits.  NEUROLOGIC:  He is alert and oriented to history taking and casual  conversation.  There was no dysarthria, no aphasia.  Cranial nerves II-  XII.  Pupil has mild anisocoria with right pupil being 1 mm enlarged  compared to the left, but both pupils were brisk reactive to light and  accommodation.  Fundi were sharp bilaterally.  Visual fields were full  on confrontational test.  Extraocular movements were full.  I did not  see any nystagmus.  Facial sensation and strength were normal.  Uvula  and tongue midline.  Head turning, shoulder shrugging were normal and  symmetric.  Tongue protrusion into cheek strength was normal.  Motor  examination:  Normal tone, bulk, and strength.  Sensory was normal to  light touch, pinprick, and vibratory sensation.  Deep tendon reflexes  were symmetric and normal.  Plantar responses were flexor.  Coordination:  He was able to perform normal finger-to-nose, heel-to-  shin, but he has profound trunk ataxia.  Gait:  He needs assistance to  get up to a standing position and was not able to keep his balance with  profound trunk ataxia with wide-based lurching gait to left.   An MRI of  the brain without contrast reviewed from [facility] in  November 2008, there was no acute lesion.   A 59 year old male with history of hypertension, hyperlipidemia,  presenting with acute onset of vertigo with associated unbalanced gait  and left retro-orbital headache.  Differential diagnoses including  cerebellum/brainstem stroke, mass, vestibular neuritis, and complicated  migraine.   PLAN:  1. Admit to 3000 telemetry bed.  2. MRI of the  brain with and without contrast.  3. MRA of the neck and intracranial vessels.  4. Echocardiogram.  5. Lab evaluation for stroke stratification.  6. PT/OT consult.      Levert Feinstein, MD  Electronically Signed     YY/MEDQ  D:  10/08/2007  T:  10/09/2007  Job:  [id number]

## 2010-07-23 NOTE — Assessment & Plan Note (Signed)
[facility]                                 ON-CALL NOTE   [name], [name]                     MRN:          [id number]  DATE:10/02/2007                            DOB:          11-Jul-1950    CALLER:  Harriett Sine, his mother.   Dr. Cato Mulligan is a regular doctor.   PHONE NUMBER:  [phone]   CHIEF COMPLAINT:  Vertigo.   Harriett Sine says that they are out of town at [place] [place].  [name] had vertigo  in the past; not since fall.  Now, it is much more severe.  He is on his  bed for 24 hours.  He cannot turn his head at all without vomiting and  cannot keep anything down at all.  His blood pressure has been up and  down in a crazy fashion.  At last check, it was 173/100 with a pulse of  87.  He cannot open his eyes at this point because he gets too severely  dizzy and they want to know if they can take him to the emergency room.  I instructed her to take him to the closest emergency room to be  evaluated now and that if he is not transportable by car, to go ahead  and call the ambulance.     Marne A. Tower, MD  Electronically Signed    MAT/MedQ  DD: 10/02/2007  DT: 10/03/2007  Job #: [id number]

## 2010-07-23 NOTE — Assessment & Plan Note (Signed)
OFFICE VISIT   Bendickson, Zakye A  DOB:  08/09/1950                                        August 13, 2009  CHART #:  [id number]   HISTORY OF PRESENT ILLNESS:  The patient returns to the office today for  routine followup status post coronary artery bypass grafting x5 on Jul 25, 2009.  His early postoperative recovery was notable for acute on  chronic renal insufficiency that resolved uneventfully.  He otherwise  did well and was discharged home without further problems.  Since  hospital discharge, the patient has made exceptionally fast recovery.  He reports essentially no pain at all.  He is not taking any sort of  pain relievers.  He has no shortness of breath.  His exercise tolerance  has been gradually improving.  He is walking for 20-30 minutes twice  every day, and he notes that he is gradually getting faster and going  further.  His appetite is good and he has continued to lose weight as he  has been very careful about maintaining a healthy diet.  He is sleeping  well at night and he has absolutely no complaints.  He is eager to get  started in the cardiac rehab program.  The remainder of his review of  systems is unremarkable.   PHYSICAL EXAMINATION:  Notable for a well-appearing male with blood  pressure 131/85, pulse 80 regular, and oxygen saturation 97% on room  air.  Examination of the chest is notable for median sternotomy incision  that is healing very nicely.  The sternum is stable on palpation.  Breath sounds are clear to auscultation and symmetrical bilaterally.  Cardiovascular exam includes regular rate and rhythm.  No murmurs, rubs,  or gallops are noted.  The abdomen is soft and nontender.  The  extremities are warm and well perfused.  There is no lower extremity  edema.  The small incision from endoscopic vein harvest is healing  nicely.   DIAGNOSTIC TESTS:  Chest x-ray performed today at the [facility] is reviewed.   This demonstrates clear lung fields with no pleural  effusions.  All of his sternal wires appear intact.  No other  abnormalities are noted.   IMPRESSION:  The patient is making exceptional progress.   PLAN:  I have encouraged the patient to continue to gradually increase  his physical activity as tolerated with his only limitation at this  point remaining that he refrain from heavy lifting or strenuous use of  his arms or shoulders for at least another 2-3 months.  I think, he is  ready to go ahead and start the cardiac rehab program and he plans to do  this down in [facility] where he has some vacation property.  All the  arrangements have been made.  I think, he can start driving an  automobile, but I have cautioned him to start driving short distances  only during the daylight.  I have also cautioned him to be careful not  to spend too much time in the hot weather and to avoid allowing himself  get dehydrated.  We will [facility] because of the fact that he has newly diagnosed  type 2 diabetes mellitus.  I feel confident that he should be able to  manage this with diet control.  All of his questions have been  addressed.  We will plan to see the patient back for followup in 8  weeks.   Salvatore Decent. Cornelius Moras, M.D.  Electronically Signed   CHO/MEDQ  D:  08/13/2009  T:  08/14/2009  Job:  [id number]   cc:   Madolyn Frieze. Jens Som, MD, [facility]  Bruce R. Juanda Chance, MD, [facility]

## 2010-07-23 NOTE — Assessment & Plan Note (Signed)
HISTORY OF PRESENT ILLNESS:  [name] is back regarding his left lateral  medullary stroke.  He has had good progress over the last week, and has  become quite a bit less sensitive to movements.  He has been working  hard with outpatient therapy for gaze stabilization and desensitizing  therapy.  He has actually done a few jobs, already two, which required  changes in position, visual tracking etc.  He had some difficulties at  first, but has done well this week.  He has been working on better diet  and blood pressure control.  He has come off the Valium as it was  beginning to make him a bit sluggish.  He denies any pain today.  He has  not used any hydrocodone.  Upon complaining his medications as a whole  other than the Valium that make him tired in the morning.  He has been  taking them all together.  He has been on Lamictal previously and using  it at nighttime.   REVIEW OF SYSTEMS:  Notable for the above.  The patient does report some  easy fatigue and need for a bit more sleep at this point.  He has  started some driving after talking with therapy, and has done well  locally.  He has not driven any long distances.  His wife goes with him  on his out of town jobs.  Appetite has been good.  Mood has been stable.   SOCIAL HISTORY:  The patient is married.  Living with his wife.  He is  extremely supportive.  He works as a Teacher, early years/pre.  He has a lot  of work planned in the upcoming months.   PHYSICAL EXAMINATION:  VITAL SIGNS:  Blood pressure is 114/89, pulse is  100, respiratory rate 18, and he is sating 97% on room air.  GENERAL:  The patient is pleasant, alert, and oriented x3.  NEUROLOGIC:  He stood up on me on entering on the room today.  We had  him walk in.  He did fine with normal gait.  However, with heal-to-toe  ambulation, he lost his balance.  We had him stand up and perform the  Romberg's test, and he had some difficulties after standing more than 15-  20 seconds,  although he was quite stable.  He had subtle decreases in  his fine motor coordination in the left side.  Left side of his face had  minimal sensory loss.  Cognitively, he is intact.  I saw no nystagmus or  visual deficits today.  Acuity is intact.  Strength is generally 5/5 in  all four limbs.  Sensation was intact in the limbs today.  HEART:  Regular rhythm.  Slightly tachycardiac.  CHEST:  Clear.  ABDOMEN:  Soft and nontender, and weight appeared to be perhaps be a bit  decreased.   ASSESSMENT:  1. Left lateral medullary stroke.  2. Hypertension.  3. Bipolar disorder.   PLAN:  1. Hadden has done very well to this point.  We talked about not      getting ahead of himself too much at this time.  I would like him      to work on building up his stamina because he will have some      physically stressful jobs coming up.  I encouraged regularly      walking both during the day, and in the evening hours.  He should      built up incrementally, and  see how his body responds.  He should      have while on his jobs, especially if they are outdoors or if they      are long in duration an area where he can rest and sit down if need      be.  He suggested that he might bring a cane with him to help with      balance if he fatigues and I think this was a great idea as well.  2. Continue with the gaze exercises, both through therapy and by      necessity with his work.  3. I asked him to follow up with Dr. 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  He is currently on Plavix and we      stressed blood pressure, cholesterol, and overall weight loss to      assist in secondary prevention here.  Dr. Pearlean Brownie had mentioned study      perhaps that he could enroll in this as well,  although the patient      will not be amenable to that.  4. I recommended changing Lamictal back to evening dosing as this is      likely causing his daytime fatigue.  5. I will have him come back and  see me as needed in the future or if      he has any further issues.  If he should have      escalating nausea, dizziness, or vertigo associated with headache,      he needs to be seen by a physician as soon as possible.      Ranelle Oyster, M.D.  Electronically Signed     ZTS/MedQ  D:  10/29/2007 15:38:42  T:  10/30/2007 07:22:53  Job #:  [id number]   cc:   Valetta Mole. Swords, MD  [address] [place]  [place] [address]

## 2010-07-23 NOTE — Discharge Summary (Signed)
[name], [name] NO.:  [id number]   MEDICAL RECORD NO.:  [id number]          PATIENT TYPE:  IPS   LOCATION:  4009                         FACILITY:  [facility]   PHYSICIAN:  Ranelle Oyster, M.D.DATE OF BIRTH:  1950-12-31   DATE OF ADMISSION:  10/11/2007  DATE OF DISCHARGE:                               DISCHARGE SUMMARY   DISCHARGE DIAGNOSES:  1. 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  2. Hypertension.  3. Hyperlipidemia.  4. Bipolar disorder.  5. Elevated hemoglobin A1c.  6. Chronic renal insufficiency.   This is a 60 year old white male with history of hypertension,  hyperlipidemia admitted on July 31, with vertigo, headache, nausea,  vomiting.  MRI showed acute versus subacute infarction, left lateral  medulla, and inferior cerebellum compatible with PICA territory  ischemia.  MRA with severe focal stenosis left V4 segment.  Echocardiogram with ejection fraction of 60% without emboli.  Neurology  consulted Dr. Pearlean Brownie, placed on Plavix therapy.  Lipid panel with  cholesterol 251, Zocor added.  Hemoglobin A1c of 6.9 and monitored.  Balance continued to affect his overall mobility.  He was seen by  Physical Medicine and Rehab and felt to be a good candidate for  inpatient rehab services.   PAST MEDICAL HISTORY:  1. Hypertension.  2. Hyperlipidemia.  3. Gout.  4. Bipolar disorder.  5. Insomnia.  6. Chronic renal insufficiency with creatinine 2.23.  No tobacco,      occasional alcohol.   ALLERGIES:  DEMEROL.   SOCIAL HISTORY:  Married.  He works as an Database administrator.  Good family support.  Two-level home, bedroom downstairs,  five steps to entry.   Functional history prior to admission, he was independent and active.   Functional status on inpatient rehab services.  He was minimum-to-  moderate assist 25 feet with a rolling walker.   Medications prior to admission were:  1. Lunesta 3 mg at bedtime.  2.  Lisinopril 20 mg daily.  3. Norvasc 10 mg daily.  4. Clonazepam 0.5 mg as needed.  5. Diazepam 10 mg four times daily.  6. Hydrocodone as needed.  7. Lamictal 200 mg daily.   PHYSICAL EXAMINATION:  GENERAL:  This was a well-appearing white male in  no acute distress, oriented x3.  CARDIAC:  Regular rate and rhythm.  ABDOMEN:  Soft, nontender.  Good bowel sounds.  LUNGS:  Clear to auscultation.  SKIN:  Intact.  EXTREMITIES:  He had some mild left upper extremity facial numbness.  Good visual fields.  Reflexes were 2+.  Motor function 5/5 on the right,  4+/5 in left upper extremity and lower.  He did have a positive left  pronator drift as well as decreased finger-to-nose exam with dysmetria  and ataxia.  Cognition was intact.   REHABILITATION HOSPITAL COURSE:  The patient was admitted to Inpatient  Rehab Services with therapies initiated on a 3-hour daily basis  consisting of physical therapy, occupational therapy, and 24-hour  rehabilitation, and nursing.  The following issues were addressed during  the patient's rehabilitation stay.  Pertaining [name] left lateral  medulla infarction, he remained on  Plavix therapy.  His balance  continued to improve.  He was ambulating 30 feet x4 with supervision to  minimal assist secondary to balance.  He did have some dizziness.  He  received a vestibular evaluation per physical therapy.  He was advised  of no driving until followup with Dr. Riley Kill.  Full family teaching was  completed and plan was to be discharged to home with ongoing therapies  as per [employer].  Blood pressures were monitored on lisinopril and  Norvasc.  His Norvasc had been decreased to 5 mg daily on August 7.  Latest blood pressure 108 diastolic 74.  He had been placed on the Zocor  for hyperlipidemia for cholesterol 251, as well as findings of elevated  hemoglobin A1c of 6.9 and blood sugars of 124, 102, 151.  He was placed  on a diabetic diet with full  diabetic teaching completed as well as for  his cholesterol.  He had a chronic renal insufficiency with creatinine  baseline of 2.23.  His latest creatinine was 2.19 and would follow with  his medical physician, Dr. Cato Mulligan.  He had a long history of bipolar  disorder.  He remained on his Lamictal 200 mg daily and Valium, which  had been scheduled at low-dose 2 mg twice daily, which the patient  tolerated this quite nicely.  He did have a history of gout.  He had a  very subtle gouty flare-up.  He was placed on prednisone 10 mg as  needed.  He had no other further complaints at that time.  He was  continent of bowel and bladder routine toileting as per rehab nursing.   LABORATORY DATA:  Latest labs showed a sodium 135, potassium 4.3, BUN  32, creatinine 2.19.  Liver function studies were within normal limits.  Hemoglobin 13.9, hematocrit 41.7, WBC of 12.9, platelet 275,000.  He  received full interdisciplinary team conferences to discuss any  discharge barriers, any equipment needs, and full family teaching.  He  was discharged to home.   DISCHARGE MEDICATIONS:  At time of dictation included:  1. Norvasc 5 mg daily.  2. Lisinopril 20 mg daily.  3. Lamictal 200 mg daily.  4. Plavix 75 mg daily.  5. Zocor 20 mg daily.  6. Foltx one tablet daily.  7. Valium 2 mg twice daily.  8. Lunesta 3 mg at bedtime as needed.  9. Hydrocodone 1 tablet every four hours as needed pain.   His diet was a diabetic diet.   SPECIAL INSTRUCTIONS:  No drinking, no driving.  He would follow up Dr.  Faith Rogue on August 25, to discuss returning to work.  He should  follow up Dr. Pearlean Brownie, Neurology Services, as during workup for stroke  noted a severe focal stenosis of left V4 segment questionable stenting  in the future and follow up Dr. Cato Mulligan, Medical Management.      Mariam Dollar, P.A.      Ranelle Oyster, M.D.  Electronically Signed    DA/MEDQ  D:  10/15/2007  T:  10/15/2007  Job:   [id number]   cc:   Valetta Mole. Swords, MD  Pramod P. Pearlean Brownie, MD

## 2010-07-23 NOTE — Discharge Summary (Signed)
[name], [name]            ACCOUNT NO.:  [id number]   MEDICAL RECORD NO.:  [id number]          PATIENT TYPE:  INP   LOCATION:  3002                         FACILITY:  [facility]   PHYSICIAN:  Pramod P. Pearlean Brownie, MD    DATE OF BIRTH:  [birth year]   DATE OF ADMISSION:  10/08/2007  DATE OF DISCHARGE:  10/11/2007                               DISCHARGE SUMMARY   ADMISSION DIAGNOSIS:  Recurrent vertigo and dizziness.   DISCHARGE DIAGNOSES:  1. 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  2. Hypertension.  3. Hyperlipidemia.  4. Obesity.  5. Hyperhomocysteinemia.   HOSPITAL COURSE:  [name] is a 60 year old Caucasian male who was  admitted with recurrent episodes of dizziness, gait ataxia off and on  since October 2008.  At that time, he had had an MRI scan of the brain  done in November 2008 was unremarkable.  Subsequently, he had a few  transient episodes of horizontal diplopia as well.  At the present time,  he came in with severe flare up of his vertigo for the last 10 days  prior to admission.  He had severe nausea and gait difficulties.  On  exam, he was found to have mild left-sided dysmetria and severe truncal  ataxia.  CT scan was unremarkable.  Subsequently, an MRI scan of the  brain was obtained which showed a left dorsal medulla and cerebellar  subacute infarct.  MRA of the brain showed severe focal stenosis of the  terminal V4 segment of the left vertebral artery and vertebrobasilar  junction.  His labs showed a hemoglobin A1c of 6.9 and homocystine was  elevated at 17.4.  Total cholesterol was elevated at 251 with ADL of  175, HDL of 31 and triglycerides of 224.  Two-D echo showed normal  ejection fraction without obvious cardiac source of embolism.  The  patient was found to have significant gait ataxia and was felt not to be  a good candidate to go home to walk safely.  Physical and  occupational  therapy recommended consult rehab.  He was a good candidate for  inpatient rehab and he was transferred to rehab and his workup was  completed.  Rest of the labs showed normal thyroid function tests.  C-  reactive protein was slightly elevated at 1.1.  Urine drug screen was  negative.  His electrolytes were normal.  WBC count was slightly  elevated at 12.5.  On the day of discharge, the patient was able to walk  with some assistance with severe truncal ataxia.  He was transferred to  rehab and advised to follow up with Dr. Pearlean Brownie in his office in 2 months  and with his primary care physician in 1 month.  At the time of  discharge, his medications were Plavix 75 mg a day, amlodipine 10 mg  daily, lisinopril 20 mg a day,  lamotrigine 200 daily, Lunesta 2 mg at  bedtime, Valium 5 mg p.o. t.i.d. p.r.n., Percocet 5/325 t.i.d. as needed  for headache, indomethacin 75 mg p.r.n. pain, Protonix 40  mg a day and  Zocor 20 mg a day.           ______________________________  Sunny Schlein. Pearlean Brownie, MD     PPS/MEDQ  D:  11/22/2007  T:  11/23/2007  Job:  [id number]

## 2010-07-23 NOTE — H&P (Signed)
NAMEAURELIUS, [name] NO.:  [id number]   MEDICAL RECORD NO.:  [id number]          PATIENT TYPE:  IPS   LOCATION:  4009                         FACILITY:  [facility]   PHYSICIAN:  Ranelle Oyster, M.D.DATE OF BIRTH:  Sep 30, 1950   DATE OF ADMISSION:  10/11/2007  DATE OF DISCHARGE:                              HISTORY & PHYSICAL   CHIEF COMPLAINT:  Decreased balance and dizziness.   HISTORY OF PRESENT ILLNESS:  This is a 60 year old white male with a  history of hypertension and hyperlipidemia admitted on July 31, with  vertigo and headaches as well as nausea and vomiting.  MRI was positive  for acute or subacute infarct in the left lateral medulla and inferior  cerebellum and compatible with a plica territory event.  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  Echocardiogram was  unremarkable.  Neurology recommended Plavix for stroke prophylaxis.  Lipid panel revealed an elevated cholesterol and Zocor was added.  Hemoglobin A1c was 6.9.  The patient continues to struggle with balance  and dizziness, although he is improving and it was felt that he could  potentially benefit from inpatient rehab.  Rehab was consulted by  Neurology, and the patient ultimately admitted today.   REVIEW OF SYSTEMS:  Notable for improved sleep.  Nausea and vomiting are  improving.  Appetite has been good.  Bowel and bladder habits have been  regular.  A 14-point reviews in the written history and physical.   PAST MEDICAL HISTORY:  Positive for hypertension, hyperlipidemia, gout,  bipolar disorder, insomnia, and chronic renal insufficiency.  He does  not smoke, occasionally drinks.   FAMILY HISTORY:  Positive for CAD and diabetes.   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  He has good family support.  He has a 2-level house  with bedroom downstairs of five steps to enter.   FUNCTIONAL HISTORY:  The patient is independent  and active prior to  arrival.  Currently with therapies min-to-mod assist 25 feet with  rolling walker.  He is min-to-mod assist with ADLs due to decreased  balance and vertigo.   ALLERGIES:  DEMEROL.   HOME MEDICATIONS:  Lunesta, lisinopril, amlodipine, clonazepam,  diazepam, hydrocodone, and Lamictal.   LABORATORY DATA:  As of yesterday, hemoglobin 13.9, white count 12.5,  platelets 272,000, sodium 139, potassium 4.5, BUN 231, and creatinine  2.17.   PHYSICAL EXAMINATION:  Blood pressure is 92/66, pulse is 87, respiratory  rate 18, and temperature is 98.1.  The patient is pleasant, alert and  oriented x3, and sitting comfortably in bed.  Pupils equal, round, and  reactive to light.  Ear, nose, and throat exam is notable for good  dentition and pink moist mucosa.  Neck is supple without JVD or  lymphadenopathy.  Chest is clear to auscultation bilaterally without  wheezes, rales, or rhonchi.  Heart is regular rate and rhythm without  murmurs, rubs, or gallops.  Extremities showed no clubbing, cyanosis, or  edema.  Abdomen is soft, nontender, and bowel sounds positive.  The  patient is moderately overweight.  Skin  is intact throughout without any  signs of breakdown.  Neurologically, cranial nerves II through XII are  intact today except for some mild left upper facial numbness.  He has  good visual fields.  Eyes have no nystagmus, and the patient experienced  no vertigo during our exam despite changes in sitting position, head  turning, etc.  Reflexes are 2+.  Sensation noted above.  He has intact  sense in the left arm and leg.  Motor function is 5/5 in the right and  4+/5 in the left upper and lower.  He did have positive left pronator  drift as well as a decreased finger-to-nose exam with dysmetria and  ataxia seen.  Rapid alternating movements of the left hand also were  impaired slightly.  Cognition was good with good judgment, orientation,  memory, and insight.  Mood overall  was very pleasant.   ASSESSMENT/PLAN:  1. 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      The patient was admitted to receive collaborative interdisciplinary      care between the physiatrist, rehab nursing staff, and therapy      team.  The patient's level of medical complexity and substantial      therapy needs described above and below in context of the patient's      medical necessity cannot be provided at a lesser intensity of care.      The physiatrist will provide 24-hour management and medical needs      as well as oversight of therapy, plan, and treatment.  He also will      provide guidance as appropriate regarding the interaction of      therapy with the medical issues.  A 24-hour rehab nursing will      assist in the management of the patient's safety needs as well as      medications, bowel and bladder function and nutrition.  They will      also integrate therapy concepts, techniques, education, etc..  PT      will assess and treat for dynamic balance in standing and      transferring and assess along with OT for vertigo with      desensitizing exercises.  OT will assess and treat for safety in      ADLs and using appropriate equipment.  Social worker will assess      for psychosocial needs and discharge planning.  Team conferences      will be held weekly to establish goals, assess progress, and to      determine various discharge.  Rehab goals are modified independent.      Estimated length of stay is approximately 7 days.  Prognosis is      good.  2. Blood pressure.  We will need to establish better control in the      long, long run here.  Acutely blood pressure has improved.  He will      continue on lisinopril and Norvasc and we will monitor this daily.  3. Hyperlipidemia:  Zocor was added upon admission.  The patient will      need follow up cholesterol panel upon  discharge.  4. Bipolar disorder.  The patient appears fairly well controlled and      maintained on Lamictal and Valium.  5. Elevated hemoglobin A1c.  We will check sugars while on rehab.  We  will offer dietary counseling for appropriate intake in the setting      of this and the above medical issues.  6. Chronic renal insufficiency.  We will monitor serially while on      rehab.  Baseline creatinine is around 2.2.  7. Anticoagulation.  Continue Plavix per Neurology's recommendations.  8. Neuro.  The patient may be a candidate for stenting protocol.  We      will defer to Dr. Pearlean Brownie in this regard.      Ranelle Oyster, M.D.  Electronically Signed     ZTS/MEDQ  D:  10/11/2007  T:  10/12/2007  Job:  [id number]

## 2010-07-26 NOTE — H&P (Signed)
[facility]  Patient:    ZAMEER, BORMAN Visit Number: [id number] MRN: [id number]          Service Type: MED Location: [phone] Attending Physician:  Judie Petit Dictated by:   Valetta Mole Swords, M.D. [facility] Admit Date:  05/18/2001                           History and Physical  DATE OF BIRTH:  [birth year].  BRIEF HISTORY:  Mr. Laneta Simmers is a 60 year old gentleman who walks in complaining of abdominal pain and chest pain.  He reports that 10 days ago he was in [place], [place], had profound diarrhea after eating some unusual foods.  This resolved spontaneously.  He returned to his normal state of good health but then 3 days ago developed some abdominal pain which now has progressed to diffuse abdominal pain.  His last normal bowel movement was 2 days ago.  In the past 2 days he has had small hard stools.  Location of pain is diffuse.  He denies any fevers.  He has tried to take some over-the-counter laxatives and Rolaids with minimal results.  He has had some minor nausea but no emesis.  He has had no fever.  No known GI blood loss.  No emesis.  PAST MEDICAL HISTORY: 1. Hypertension. 2. Gout. 3. Renal insufficiency, chronic.  CURRENT MEDICATIONS: 1. Norvasc 5 mg p.o. q. day. 2. Prinivil 20 mg p.o. q. day. 3. allopurinol 300 mg p.o. q. day.  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  He is married.  He does not smoke.  He drinks occasional alcohol.  FAMILY HISTORY:  Mother had breast cancer and a history of depression.  Father deceased with myocardial infarction.  He also had diabetes.  He has a brother recently recovering from diverticulitis and a sister alive and well.  REVIEW OF SYSTEMS:  He denies any true shortness of breath.  He states that the epigastric/diffuse abdominal pain can be so intense that he feels somewhat short of breath.  He denies any true chest pain.  He denies any  lower extremity edema, any weakness, any headaches.  He denies any other complaints in the review of systems.  PHYSICAL EXAMINATION:  VITAL SIGNS:  Temperature 98.0, pulse 100, respirations 20, blood pressure 140/84.  GENERAL:  He appears as an uncomfortable well-developed well-nourished male in no acute distress.  HEENT:  Atraumatic, normocephalic.  Extraocular movements are intact.  He is anicteric.  There is no jaundice.  NECK:  Supple without lymphadenopathy, thyromegaly, jugular venous distension or carotid bruits.  CHEST:  Clear to auscultation without an increased work of breathing.  CARDIAC:  S1, S2 are normal without murmurs or gallops.  ABDOMEN:  Active bowel sounds.  Soft, nontender, to palpation.  There is no rebound or guarding tenderness.  No masses are palpated.  There is no hepatosplenomegaly.  RECTAL:  NOrmal tone.  Hemoccult positive stools.  Prostate normal size and nontender.  NEUROLOGIC:  He is alert, oriented and moves all 4 extremities.  His judgment and affect are normal.  EKG:  EKG was done and demonstrates sinus tachycardia otherwise unremarkable.  PROBLEMS LIST:  #1 - ACUTE ABDOMINAL PAIN WITH HEMOCCULT POSITIVE STOOLS:  He is uncomfortable enough that he needs further evaluation.  PLAN:  Will admit, obtain CMET, CBC, UA, amylase, lipase.  I wonder if he does have pancreatitis.  Will obtain a GI consult given the Hemoccult positive stools.  He will get an acute abdominal series and a CT of the abdomen and pelvis.  Will keep n.p.o. and on IV fluids.  RENAL INSUFFICIENCY:  This is a chronic problem.  I doubt this is playing a role in his current illness. Dictated by:   Valetta Mole Swords, M.D. [facility] Attending Physician:  Judie Petit DD:  05/18/01 TD:  05/18/01 Job: [id number]

## 2010-07-26 NOTE — Discharge Summary (Signed)
[place]  Patient:    [name], [name] Visit Number: [id number] MRN: [id number]          Service Type: MED Location: [phone] Attending Physician:  Judie Petit Dictated by:   Valetta Mole Swords, M.D. [facility] Admit Date:  05/18/2001 Disc. Date: 05/20/01                             Discharge Summary  DISCHARGE DIAGNOSES: 1. Pancreatitis, unknown cause. 2. Heme-positive stools. 3. Gout. 4. Renal insufficiency. 5. Hypertension. 6. Febrile illness, likely secondary to pancreatitis.  DISCHARGE MEDICATIONS: 1. Protonix 40 mg p.o. q.d. 2. Flagyl 500 mg p.o. t.i.d. for five days. 3. Cipro 500 mg p.o. b.i.d. for five days.  FOLLOW-UP PLANS:  Dr. Cato Mulligan, two to three weeks.  CONDITION ON DISCHARGE:  Improved.  LABORATORY DATA:  Discharge hemoglobin 12.2, discharge white blood cell count 20.1, maximum white blood cell count 24.6.  Sedimentation rate 34.  Admission amylase elevated are 143, lipase 75.  CKs and troponins negative.  PROCEDURES:  Ultrasound of the abdomen demonstrated no evidence of cholelithiasis or acute cholecystitis.  Pancreas not well-visualized.  CT of the abdomen demonstrated inflammation around the head of the pancreas consistent with pancreatitis.  There was also mild bibasilar atelectasis.  CT of the pelvis demonstrated diverticulosis without diverticulitis and mild prostate enlargement.  HOSPITAL COURSE:  #1 -  GASTROINTESTINAL.  Patient admitted with abdominal pain.  Pain thought secondary to pancreatitis, perhaps caused by inflammation from indomethacin.  Recommendation from GI was that the patient continue on proton pump inhibitors.  Heme-positive stools will be followed as an outpatient.  #2 -  INFECTIOUS DISEASE.  Patient febrile during the hospitalization, will be maintained on oral outpatient antibiotics.  White blood cell count down to 20.1 at the time of discharge.  Patient understands to call for  recurrent fever.  #3 -  SOCIAL SITUATION.  Patient is eager to leave the hospital because of commitments he has this weekend.  He understands to maintain a low-fat, low-residual diet.  This was explained to him in detail with his wife present in the room.  He will follow up with me in two to three weeks.  He will call for any fever or recurrent abdominal pain.  #4 -  HYPERTENSION.  The patients blood pressure was adequately controlled during the hospitalization.  He will continue his Norvasc and Prinivil as an outpatient as well as allopurinol.  #5 -  GOUT.  The patient will avoid nonsteroidal anti-inflammatories for the time being.  #6 -  RENAL INSUFFICIENCY, CHRONIC.  This will be followed up as an outpatient. Dictated by:   Valetta Mole Swords, M.D. [facility] Attending Physician:  Judie Petit DD:  05/20/01 TD:  05/20/01 Job: [id number]

## 2010-09-17 ENCOUNTER — Other Ambulatory Visit: Payer: Self-pay | Admitting: *Deleted

## 2010-09-17 MED ORDER — ALLOPURINOL 300 MG PO TABS: 300.0000 mg | ORAL_TABLET | Freq: Every day | ORAL | Status: DC

## 2010-12-03 ENCOUNTER — Other Ambulatory Visit: Payer: Self-pay | Admitting: Cardiology

## 2010-12-03 MED ORDER — METOPROLOL SUCCINATE ER 50 MG PO TB24: 50.0000 mg | ORAL_TABLET | Freq: Every day | ORAL | Status: DC

## 2010-12-06 LAB — DIFFERENTIAL
Basophils Absolute: 0.1
Basophils Relative: 0
Eosinophils Relative: 2
Lymphocytes Relative: 23

## 2010-12-06 LAB — URINE DRUGS OF ABUSE SCREEN W ALC, ROUTINE (REF LAB)
Amphetamine Screen, Ur: NEGATIVE
Barbiturate Quant, Ur: NEGATIVE
Ethyl Alcohol: <10
Marijuana Metabolite: NEGATIVE
Methadone: NEGATIVE
Phencyclidine (PCP): NEGATIVE
Propoxyphene: NEGATIVE

## 2010-12-06 LAB — CBC
HCT: 41.7
MCV: 84.4
MCV: 85.8
Platelets: 279
RBC: 4.95
RBC: 5.03
WBC: 12.5 — ABNORMAL HIGH
WBC: 12.9 — ABNORMAL HIGH
WBC: 12.9 — ABNORMAL HIGH

## 2010-12-06 LAB — COMPREHENSIVE METABOLIC PANEL
AST: 17
AST: 17
Albumin: 3.7
BUN: 32 — ABNORMAL HIGH
CO2: 23
Calcium: 9.2
Chloride: 103
Chloride: 106
Creatinine, Ser: 2.19 — ABNORMAL HIGH
Creatinine, Ser: 2.23 — ABNORMAL HIGH
GFR calc Af Amer: 37 — ABNORMAL LOW
GFR calc Af Amer: 38 — ABNORMAL LOW
GFR calc non Af Amer: 32 — ABNORMAL LOW
Sodium: 138
Total Bilirubin: 0.7
Total Bilirubin: 0.9

## 2010-12-06 LAB — HEMOGLOBIN A1C
Hgb A1c MFr Bld: 6.9 — ABNORMAL HIGH
Mean Plasma Glucose: 168

## 2010-12-06 LAB — BASIC METABOLIC PANEL
Calcium: 9.2
Creatinine, Ser: 2.17 — ABNORMAL HIGH
GFR calc Af Amer: 39 — ABNORMAL LOW
GFR calc non Af Amer: 32 — ABNORMAL LOW

## 2010-12-06 LAB — BENZODIAZEPINE, QUANTITATIVE, URINE
Alprazolam (GC/LC/MS), ur confirm: NEGATIVE
Oxazepam GC/MS Conf: NEGATIVE

## 2010-12-06 LAB — LIPID PANEL
HDL: 31 — ABNORMAL LOW
Total CHOL/HDL Ratio: 8.1
Triglycerides: 224 — ABNORMAL HIGH
VLDL: 45 — ABNORMAL HIGH

## 2010-12-06 LAB — VITAMIN B12: Vitamin B-12: 493 (ref 211–911)

## 2010-12-06 LAB — TSH: TSH: 2.874

## 2011-01-20 ENCOUNTER — Other Ambulatory Visit: Payer: Self-pay | Admitting: *Deleted

## 2011-01-20 MED ORDER — AMLODIPINE BESYLATE 10 MG PO TABS: 10.0000 mg | ORAL_TABLET | Freq: Every day | ORAL | Status: DC

## 2011-02-18 ENCOUNTER — Other Ambulatory Visit: Payer: Self-pay | Admitting: *Deleted

## 2011-02-18 MED ORDER — CLOPIDOGREL BISULFATE 75 MG PO TABS: 75.0000 mg | ORAL_TABLET | Freq: Every day | ORAL | Status: DC

## 2011-03-07 ENCOUNTER — Telehealth: Payer: Self-pay | Admitting: Family Medicine

## 2011-03-07 MED ORDER — ALLOPURINOL 300 MG PO TABS: 300.0000 mg | ORAL_TABLET | Freq: Every day | ORAL | Status: DC

## 2011-03-07 NOTE — Telephone Encounter (Signed)
Pt called req refill of allopurinol (ZYLOPRIM) 300 MG tablet to [facility]. Pt needs this called in before middle of next wk. Pt aware that pcp out of town.

## 2011-04-02 ENCOUNTER — Other Ambulatory Visit: Payer: Self-pay | Admitting: Cardiology

## 2011-04-02 MED ORDER — METOPROLOL SUCCINATE ER 50 MG PO TB24: 50.0000 mg | ORAL_TABLET | Freq: Every day | ORAL | Status: DC

## 2011-04-02 NOTE — Telephone Encounter (Signed)
Refill   Patient is out of his medication. METOPROLOL SUCCINATE ER 50 MG PO TB24 Can be reached at Hm# if there are any Questions

## 2011-04-07 ENCOUNTER — Telehealth: Payer: Self-pay | Admitting: *Deleted

## 2011-04-07 MED ORDER — PREDNISONE 20 MG PO TABS: 20.0000 mg | ORAL_TABLET | Freq: Every day | ORAL | Status: AC

## 2011-04-07 NOTE — Telephone Encounter (Signed)
Pt is asking for Prednisone for an acute flare of gout to [facility])   Does not do well with antiinflammatories.

## 2011-04-07 NOTE — Telephone Encounter (Signed)
Prednisone 20mg  2 daily for 5 days prn gout flare, disp #20

## 2011-04-11 ENCOUNTER — Encounter: Payer: Self-pay | Admitting: Cardiology

## 2011-06-03 ENCOUNTER — Other Ambulatory Visit: Payer: Self-pay | Admitting: *Deleted

## 2011-06-03 MED ORDER — METOPROLOL SUCCINATE ER 50 MG PO TB24: 50.0000 mg | ORAL_TABLET | Freq: Every day | ORAL | Status: DC

## 2011-06-03 MED ORDER — AMLODIPINE BESYLATE 10 MG PO TABS: 10.0000 mg | ORAL_TABLET | Freq: Every day | ORAL | Status: DC

## 2011-06-03 MED ORDER — CLOPIDOGREL BISULFATE 75 MG PO TABS: 75.0000 mg | ORAL_TABLET | Freq: Every day | ORAL | Status: DC

## 2011-06-04 ENCOUNTER — Telehealth: Payer: Self-pay

## 2011-06-04 NOTE — Telephone Encounter (Signed)
LMTCB

## 2011-06-04 NOTE — Telephone Encounter (Signed)
Spoke to pt, and he will schedule an appt with Dr. Caryl Never for evaluation and possible labs.

## 2011-06-04 NOTE — Telephone Encounter (Signed)
MANY factors can contribute-medications, mouth washes, toothepastes, etc.  Doubt related to diabetes. I recommend evaluation. Needs diabetes evaluation anyway.

## 2011-06-04 NOTE — Telephone Encounter (Signed)
Pt states for the past 1.5 weeks his taste buds have been "going crazy".  Per pt everything he drinks excluding water and ice tea have a "bitter taste". Pt states he has been taking raspberry pills per Dr. Neil Crouch to assist with weight loss. Pt states he stopped these pills about 61 yo 6 days ago and has seen some improvement. Per pt everything he taste such as grape fruit juice or orange juice taste like it has no sugar in it.   Pt states the bitter taste is worse at different times of the day.  Pt would like to know what this could be a symptoms of and if it could be related to diabetes.  Pls advise.  Pt offered appt and would like to see what Dr. Caryl Never recommends first.

## 2011-07-04 ENCOUNTER — Telehealth: Payer: Self-pay | Admitting: Cardiology

## 2011-07-04 MED ORDER — HYDRALAZINE HCL 25 MG PO TABS: 25.0000 mg | ORAL_TABLET | Freq: Three times a day (TID) | ORAL | Status: DC

## 2011-07-04 NOTE — Telephone Encounter (Signed)
New msg Pt wants to talk to you about his BP 152/96. He wants to know should he increase his med.

## 2011-07-04 NOTE — Telephone Encounter (Signed)
Hydralazine 25 mg po tid Oziel Beitler  

## 2011-07-04 NOTE — Telephone Encounter (Signed)
Left message for pt of dr crenshaw's recommendations  

## 2011-07-04 NOTE — Telephone Encounter (Signed)
Spoke with pt, for the last 2 and 1/2 weeks the pts bp has been running 142-152/94-96. He has been under a lot of stress with his job, he had lost 20 lbs but has gained it all back. In the past when this happened he took an extra metoprolol at bedtime. He wanted to know what to do. Will forward for dr Jens Som review

## 2011-10-02 ENCOUNTER — Ambulatory Visit: Payer: BC Managed Care – PPO | Admitting: Cardiology

## 2011-11-13 IMAGING — US US ABDOMEN COMPLETE
1 series · 13 of 25 positions shown · non-contrast
Comparison: Ultrasound of the kidneys of 06/13/2009

CLINICAL DATA: Elevated liver function tests, hypertension on
medication

COMPLETE ABDOMINAL ULTRASOUND

[Series 1: us abdomen complete · 0.41mm/px · 13 of 83 slices shown]
[im 1/83]
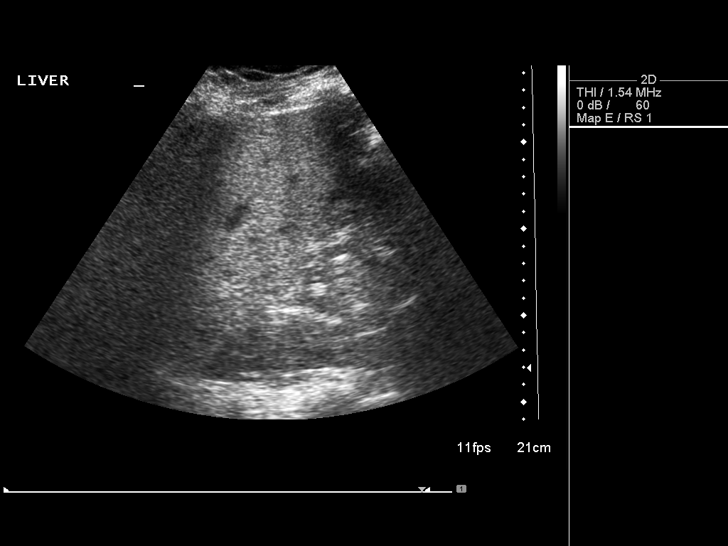
[im 7/83]
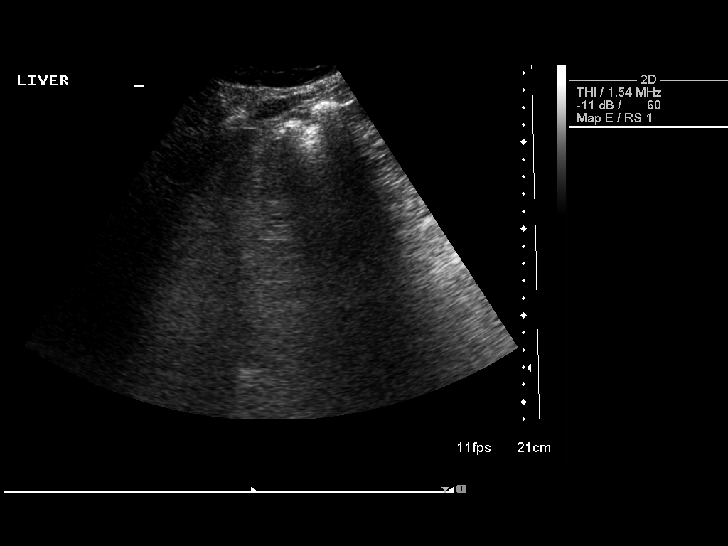
[im 14/83]
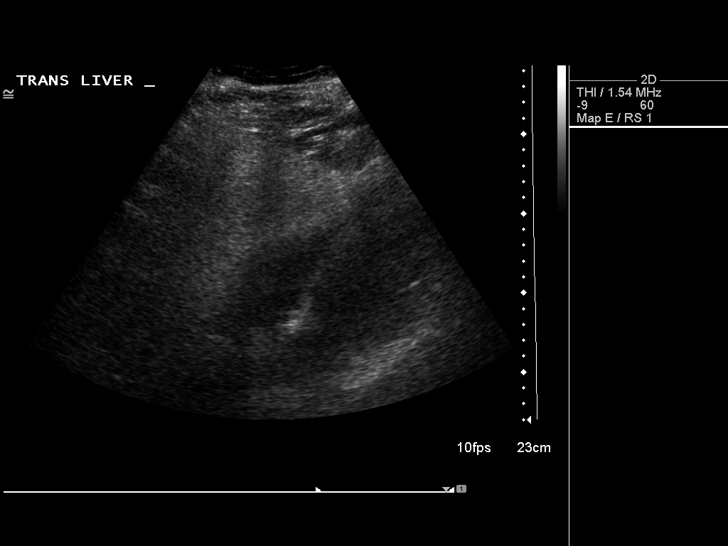
[im 21/83]
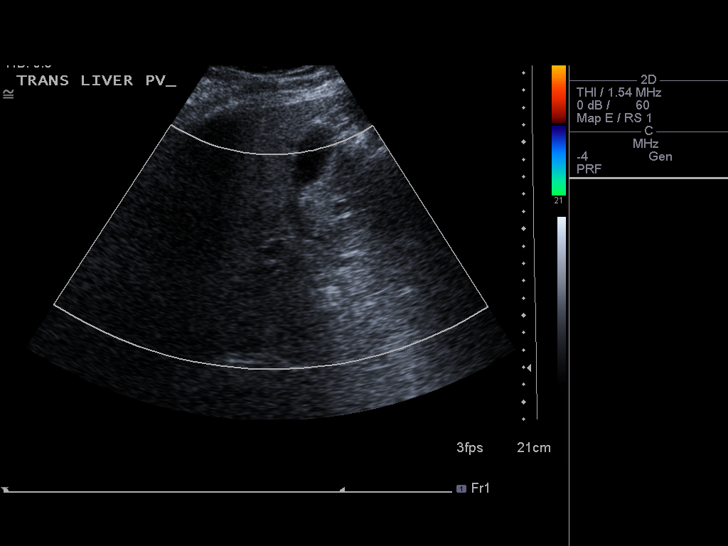
[im 28/83]
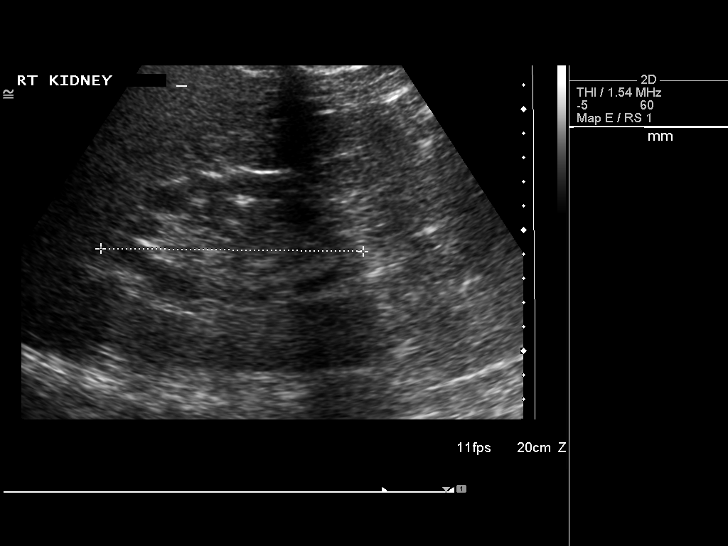
[im 35/83]
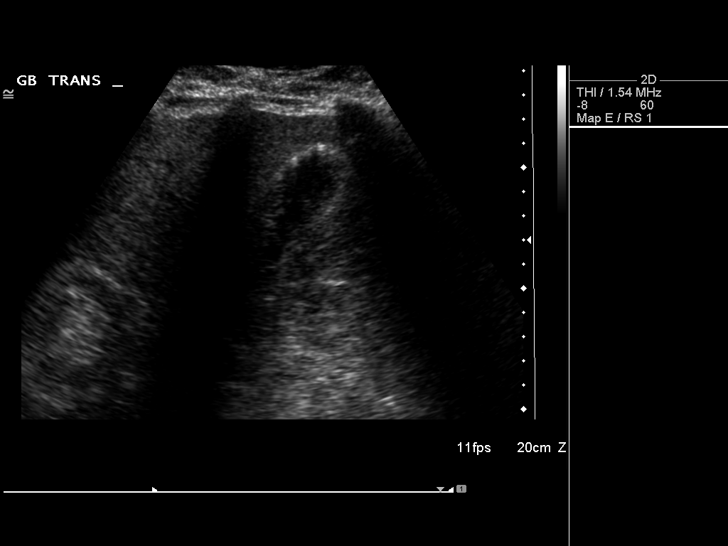
[im 42/83]
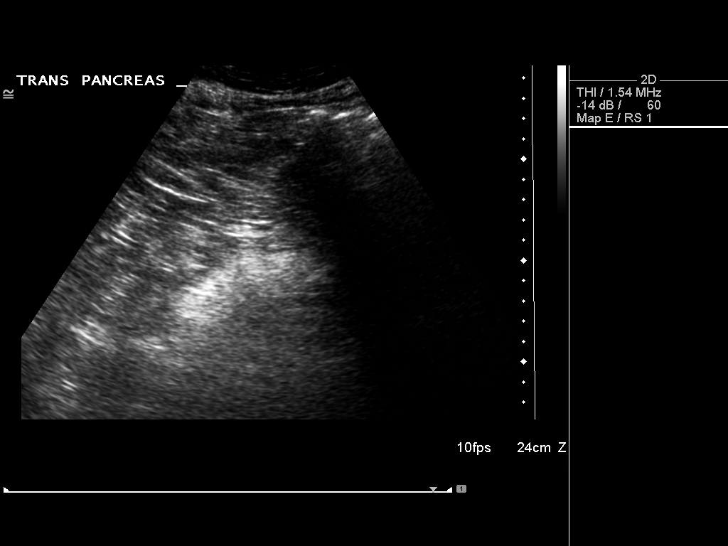
[im 48/83]
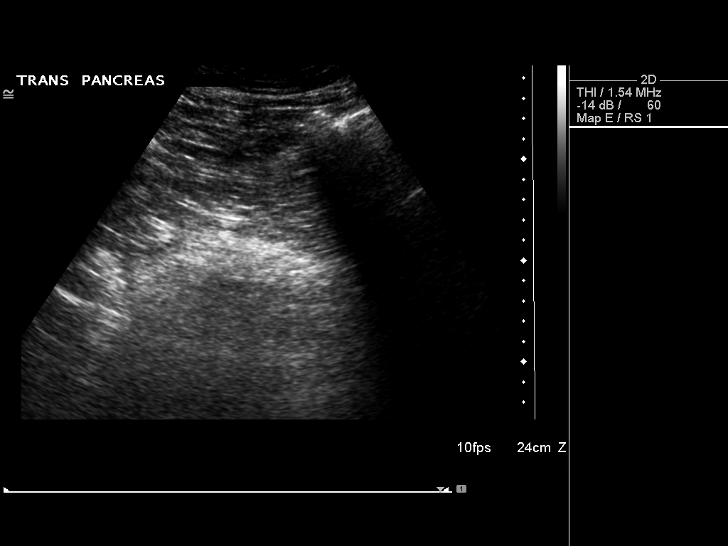
[im 55/83]
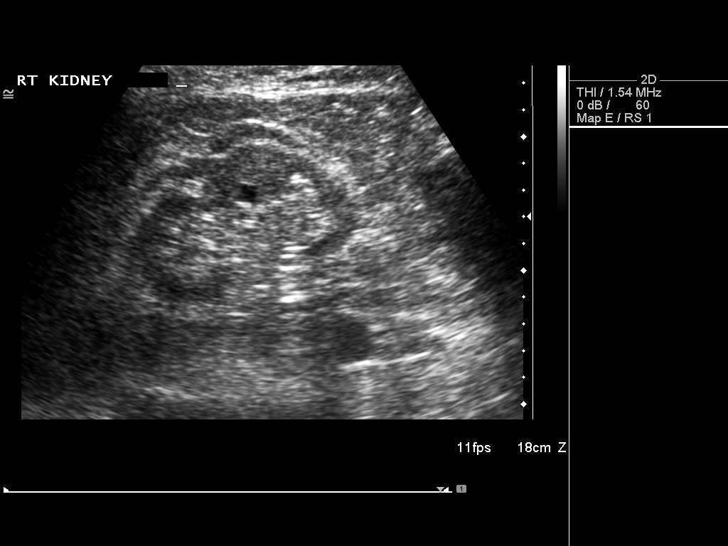
[im 62/83]
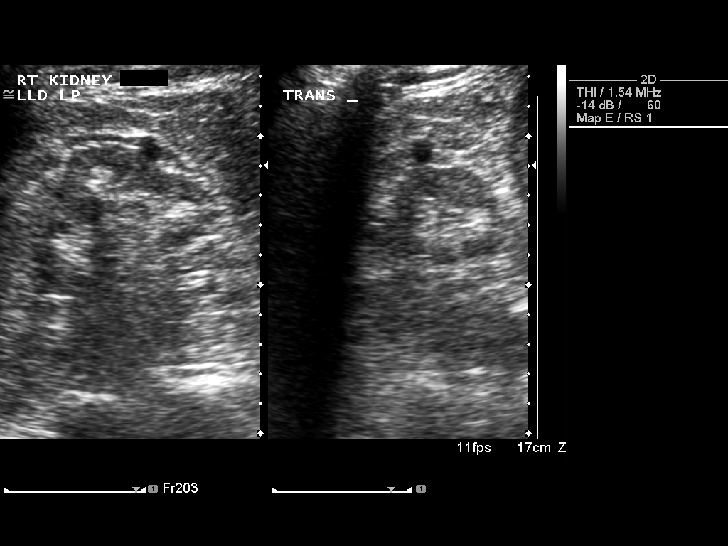
[im 69/83]
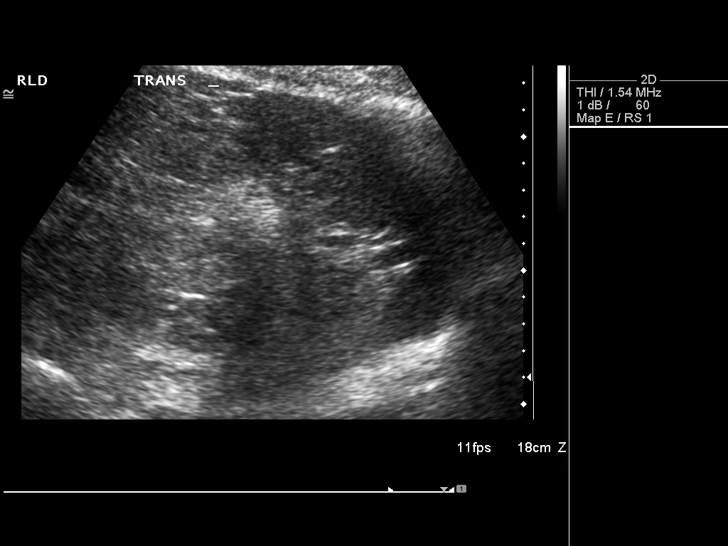
[im 76/83]
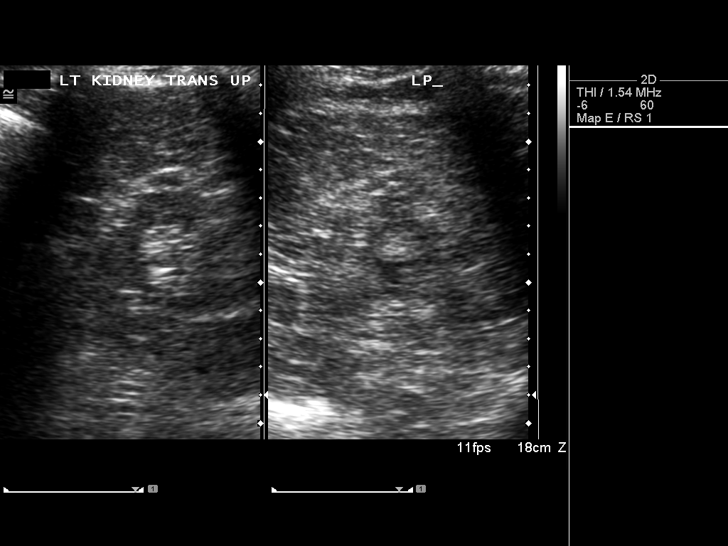
[im 83/83]
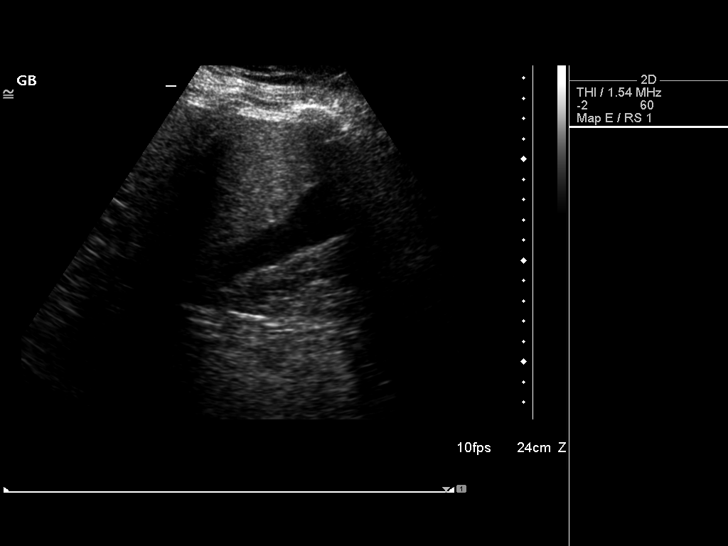

[13 of 25 positions shown; findings below may reference images not displayed]

FINDINGS: Gallbladder:  Echogenic foci are noted in the gallbladder wall with
a ring down artifact consistent with changes of adenomyomatosis.
No gallstones are seen and there is no pain present over
gallbladder with compression.

Common bile duct:  The common bile duct measures 3.2 mm in
diameter.

Liver:  The liver is slightly echogenic consistent with fatty
infiltration with sparing near the gallbladder.

IVC:  The IVC is partially obscured by bowel gas.

Pancreas:  The pancreas also is obscured by bowel gas.

Spleen:  The spleen is normal measuring 6.1 cm sagittally.

Right Kidney:  No hydronephrosis is seen.  The right kidney
measures 10.9 cm sagittally.  Small cysts are noted of no more than
1 cm in maximum.There may be some thinning of the renal cortex.

Left Kidney:  No hydronephrosis.  The left kidney measures 11.1 cm.
Probable small cyst is noted of 1.7 cm in maximum diameter. There
appears to be some thinning of the renal cortex.  Correlate with
renal laboratory values.

Abdominal aorta:  The abdominal aorta is partially obscured by
bowel gas.

This study is somewhat compromised by bowel gas and patient body
habitus.
IMPRESSION: 1.  Changes of gallbladder adenomyomatosis.  No gallstones or pain
over gallbladder.
2.  Fatty infiltration of the liver with areas of sparing.  No
ductal dilatation.
3.  The pancreas is obscured by bowel gas.
4.  Some thinning of the renal cortex.  Correlate with the renal
laboratory values.

## 2012-02-12 ENCOUNTER — Encounter: Payer: Self-pay | Admitting: Cardiology

## 2012-02-24 ENCOUNTER — Encounter: Payer: Self-pay | Admitting: Cardiology

## 2012-02-26 ENCOUNTER — Other Ambulatory Visit: Payer: BC Managed Care – PPO

## 2012-03-16 ENCOUNTER — Encounter: Payer: Self-pay | Admitting: Family Medicine

## 2012-03-16 ENCOUNTER — Ambulatory Visit (INDEPENDENT_AMBULATORY_CARE_PROVIDER_SITE_OTHER): Payer: BC Managed Care – PPO | Admitting: Family Medicine

## 2012-03-16 VITALS — BP 140/92 | HR 80 | Temp 97.9°F | Resp 12 | Ht 70.75 in | Wt 260.0 lb

## 2012-03-16 DIAGNOSIS — E785 Hyperlipidemia, unspecified: Secondary | ICD-10-CM

## 2012-03-16 DIAGNOSIS — E119 Type 2 diabetes mellitus without complications: Secondary | ICD-10-CM

## 2012-03-16 DIAGNOSIS — I2510 Atherosclerotic heart disease of native coronary artery without angina pectoris: Secondary | ICD-10-CM

## 2012-03-16 DIAGNOSIS — E669 Obesity, unspecified: Secondary | ICD-10-CM

## 2012-03-16 DIAGNOSIS — I10 Essential (primary) hypertension: Secondary | ICD-10-CM

## 2012-03-16 DIAGNOSIS — Z0000 Encounter for general adult medical examination without abnormal findings: Secondary | ICD-10-CM

## 2012-03-16 DIAGNOSIS — Z23 Encounter for immunization: Secondary | ICD-10-CM

## 2012-03-16 MED ORDER — ROSUVASTATIN CALCIUM 40 MG PO TABS: 40.0000 mg | ORAL_TABLET | Freq: Every day | ORAL | Status: DC

## 2012-03-16 MED ORDER — TETANUS-DIPHTH-ACELL PERTUSSIS 5-2.5-18.5 LF-MCG/0.5 IM SUSP: 0.5000 mL | Freq: Once | INTRAMUSCULAR | Status: DC

## 2012-03-16 NOTE — Progress Notes (Signed)
Subjective:    Patient ID: [name], male    DOB: 10/06/1950, 62 y.o.   MRN: [id number]  HPI Patient seen for complete physical. He has had very inconsistent primary care followup. He's not had a physical in some time. He has multiple chronic problems including obesity, type 2 diabetes, CAD with prior bypass, hypertension, hyperlipidemia, bipolar disorder, history of gout, BPH, intermittent insomnia, hx CVA, and obstructive sleep apnea. He is followed regularly by psychiatry regarding his bipolar which is currently stable with Lamictal 200 mg one and one half tablets daily.  He has chronic kidney disease with baseline creatinine around 2.8 and is followed closely by nephrology. He has never had colonoscopy screening. He is interested in making lifestyle changes at this point and would like to consider nutrition consult. He has not been taking his Crestor over the past few weeks after running out of prescription and needs refills. He did not have any lab work prior to today other than recent renal profile per nephrology. No flu vaccine yet. Last tetanus unknown. Travels frequently with his work.  He plays tennis about once per week otherwise no consistent exercise. Steady weight gain in recent years. Eats out frequently with travel.  Past Medical History  Diagnosis Date  . Hyperlipidemia   . CAD (coronary artery disease)   . Hypertension   . Homocystinemia   . Obesity   . Diabetes mellitus     type II  . Sleep apnea   . Benign prostatic hypertrophy   . Bipolar affective disorder, mixed, moderate   . Renal insufficiency   . Nephrolithiasis   . Gout   . 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 Ankle arthroscopy   . Cosmetic surgery     chin  . Liposuction   . Nasal septum surgery   . Ankle surgery     Left  . Median sternotomy 07/25/2009    reports that he has never smoked. He does not have any smokeless tobacco history on file.  His alcohol and drug histories not on file. family history includes Cancer in his mother; Depression in his mother; Diabetes in his father; and Heart attack in his father. Allergies  Allergen Reactions  . Nsaids       Review of Systems  Constitutional: Negative for fever, chills, activity change, appetite change and unexpected weight change.  HENT: Negative for sore throat and trouble swallowing.   Respiratory: Negative for cough and wheezing.   Cardiovascular: Negative for chest pain, palpitations and leg swelling.  Gastrointestinal: Positive for diarrhea. Negative for nausea, vomiting, abdominal pain, constipation and blood in stool.  Genitourinary: Negative for dysuria.  Neurological: Negative for dizziness, seizures, syncope, weakness and headaches.  Hematological: Negative for adenopathy. Does not bruise/bleed easily.  Psychiatric/Behavioral: Positive for sleep disturbance. Negative for confusion and self-injury.       Objective:   Physical Exam  Constitutional: He is oriented to person, place, and time.       Alert pleasant obese 62 year old male  HENT:  Right Ear: External ear normal.  Left Ear: External ear normal.  Mouth/Throat: Oropharynx is clear and moist.  Neck: Neck supple. No thyromegaly present.       No carotid bruit  Cardiovascular: Normal rate and regular rhythm.   Pulmonary/Chest: Effort normal and breath sounds normal. No respiratory distress. He has no wheezes. He has no rales.  Abdominal: Soft. Bowel sounds are normal. He exhibits no  distension and no mass. There is no tenderness. There is no rebound and no guarding.  Musculoskeletal: He exhibits no edema.  Lymphadenopathy:    He has no cervical adenopathy.  Neurological: He is alert and oriented to person, place, and time. No cranial nerve deficit.  Skin: No rash noted.  Psychiatric: He has a normal mood and affect. His behavior is normal.          Assessment & Plan:  Health maintenance. Several  issues addressed. Patient never had colonoscopy screening. Needs to lose substantial weight. Nutrition consult made. Order lab work. Since he's not been on Crestor we've elected to wait about a month we'll get lipid panel, hepatic, A1c, and PSA then. Recent renal profile per nephrology. Flu vaccine given. Tetanus booster given.  Hyperlipidemia. Currently untreated. History of CAD. History cerebrovascular disease. Refill Crestor and repeat labs in one month as above

## 2012-04-20 ENCOUNTER — Other Ambulatory Visit: Payer: BC Managed Care – PPO

## 2012-04-21 ENCOUNTER — Ambulatory Visit: Payer: BC Managed Care – PPO | Admitting: Dietician

## 2012-04-23 ENCOUNTER — Other Ambulatory Visit: Payer: BC Managed Care – PPO

## 2012-04-24 ENCOUNTER — Encounter: Payer: BC Managed Care – PPO | Attending: Family Medicine | Admitting: Dietician

## 2012-04-24 VITALS — Ht 70.75 in | Wt 256.9 lb

## 2012-04-24 DIAGNOSIS — Z713 Dietary counseling and surveillance: Secondary | ICD-10-CM | POA: Insufficient documentation

## 2012-04-24 DIAGNOSIS — E119 Type 2 diabetes mellitus without complications: Secondary | ICD-10-CM

## 2012-04-24 NOTE — Progress Notes (Signed)
Medical Nutrition Therapy:  Appt start time: 1100 end time:  1200.   Assessment:  Primary concerns today: Comes today to learn if his approach to weight loss is safe.  He has over the recent past he has gain 30 lb or more.  He has had issues with an impaired fasting glucose in the past, and at his last visit was told that his A1C was at the level of an average glucose in the 140's.  Notes that his MD told him that he needed to get the glucose levels lower and in control.  This was best done with losing weight. Has a history of CAD with a CABG in the past, HTN, hypercholesterolemia, CKD, Bipolar disorder, and a stroke.  However, he continues to work and notes that he is not ready to retire.  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  In the meanwhile, he is going to be on the road, covering numerous athletic events over the next six weeks. He comes today, having decided that in the past, he has used the [place]' diet and successfully lost weight on the diet.  He did admit that he did gain the weight lost and some more.  Yet, he is determined to pursue this diet.  He reports that once he is into the diet, it is easy for him to stay on tract.  He has many questions and has indeed done a great deal of research regarding the use of the high protein food, the use of various sugar substitutes, the use of grass fed beef, range fed chickens, wild caught salmon, and use of locally raised more organic vegetables/fruits. Reports he has lost 6 lb since his MD appointment on 03/16/2012.  He has "cut out the coke and bread is practicing portion control, and is using no red meats." Request that I give him input and answers to his many questions.   GOAL:  Over time, lose to 200 lbs.                Wants to use the high protein diet.                  Sharelle Burditt at times add some of the non-starchy veggies.   MEDICATIONS: Provided current list of the medications her is taking.   DIETARY  INTAKE:  No formal recall conducted.  Currently eating about 6 times per day. Having issues with finding a breakfast that is both filling and heart healthy. His life is very busy at this time and he is traveling a great deal. We discussed possible meals when dining out.    24-hr recall:  B ( AM):  No breakfast.  No breakfast foods. Considering eggs and [place] bacon.    On the road a lot.  Snk ( AM): drinking water.    L ( PM): ham and cheese without the bread.   Snk ( PM): none D ( PM): 2 [place] legs and green beans.   Snk ( PM): 2 string cheese. Beverages: water and propel  Usual physical activity: Currently playing tennis once a week.  Has problems with an ankle.  Estimated energy needs: HT: 10.75 in  WT:  256.9 lb  BMI: 36.3 kg/m2  Adj. WT: 203 lb (92 kg) 1800 calories 150-180 g carbohydrates 155-160 g protein 48-50 g fat  Progress Towards Goal(s):  In progress.   Nutritional Diagnosis:  [place]-3.3 Overweight/obesity As related to recent history of eating out at  restaurants a great deal, increassed use of fast foods and in large servings, and limited physical activity..  As evidenced by having gained to 256.9 lb with a BMI of 36.2 kg/m2..    Intervention:  Nutrition.  ix the Propel Water with the plain water during the day.  Remember that you will need at least 72 oz of fluid per day.  If you choose to use a sugar substitute, Stevia is less processed.   My recommendation is to have some carb in the diet (15-30 gm per meal).  This Elizabethann Lackey slow your weight loss, but this comes the closest to being the "diet that you never have to go off of."  When using the cheeses, use the low fat or the 2% cheeses when possible.  Plan to get at least 150 minutes of aerobic exercise (walking, biking, swimming) each week.  AKA 30 minutes per day.  Try using 2 egg whites with 1 whole egg.  This will decrease fat and add protein.  Consider using the whole grains, the starchy beans (pinto, black,  kidney, navy) for carb.  They have protein in them and are high in fiber.  This would take the place of potatoes, rice, and pasta.  If you decide to use the fruits; the berries and apples, greener pears are lower in carb.  Try choosing those fruits locally grown.  Always, use baking, broiling, baking, roasting, rather than frying.  You can prepare your own [place], chicken, salmon and even a beef roast at home and then freeze to have available when you return from your trips out of town.  Consider the use of a protein shake such as the Unjury (found at the [employer] or on line, hass 22 gm of protein per scoop).  This is a shake we recommend to our Bariatric surgery patients.  It can be mixed with water.  Use the www.calorieking.com web site for looking up food values when a food label is not available.  Call or e-mail with questions.  I would like to see you back if you are not losing weight and when you plan to transition back to a diet using carbohydrates.  Handouts given during visit include:  Yellow Card with protein and non-starchy vegetable suggestions  Carb counting guide by [employer]  Weight loss Guide  Monitoring/Evaluation:  Dietary intake, exercise, and body weight if and when your have issues with not losing weight, transitioning to a carb containing diet and call or e-mail with questions.

## 2012-04-24 NOTE — Patient Instructions (Addendum)
   Mix the Propel Water with the plain water during the day.  Remember that you will need at least 72 oz of fluid per day.  If you choose to use a sugar substitute, Stevia is less processed.   My recommendation is to have some carb in the diet (15-30 gm per meal).  This Paxon Propes slow your weight loss, but this comes the closest to being the "diet that you never have to go off of."  When using the cheeses, use the low fat or the 2% cheeses when possible.  Plan to get at least 150 minutes of aerobic exercise (walking, biking, swimming) each week.  AKA 30 minutes per day.  Try using 2 egg whites with 1 whole egg.  This will decrease fat and add protein.  Consider using the whole grains, the starchy beans (pinto, black, kidney, navy) for carb.  They have protein in them and are high in fiber.  This would take the place of potatoes, rice, and pasta.  If you decide to use the fruits; the berries and apples, greener pears are lower in carb.  Try choosing those fruits locally grown.  Always, use baking, broiling, baking, roasting, rather than frying.  You can prepare your own [place], chicken, salmon and even a beef roast at home and then freeze to have available when you return from your trips out of town.  Consider the use of a protein shake such as the Unjury (found at the [employer] or on line, hass 22 gm of protein per scoop).  This is a shake we recommend to our Bariatric surgery patients.  It can be mixed with water.  Use the www.calorieking.com web site for looking up food values when a food label is not available.  Call or e-mail with questions.  I would like to see you back if you are not losing weight and when you plan to transition back to a diet using carbohydrates.

## 2012-04-25 ENCOUNTER — Encounter: Payer: Self-pay | Admitting: Dietician

## 2012-05-03 ENCOUNTER — Other Ambulatory Visit (INDEPENDENT_AMBULATORY_CARE_PROVIDER_SITE_OTHER): Payer: BC Managed Care – PPO

## 2012-05-03 DIAGNOSIS — E785 Hyperlipidemia, unspecified: Secondary | ICD-10-CM

## 2012-05-03 DIAGNOSIS — Z0000 Encounter for general adult medical examination without abnormal findings: Secondary | ICD-10-CM

## 2012-05-03 LAB — CBC WITH DIFFERENTIAL/PLATELET
Basophils Relative: 0.4 % (ref 0.0–3.0)
Eosinophils Relative: 2.7 % (ref 0.0–5.0)
Hemoglobin: 14.8 g/dL (ref 13.0–17.0)
Lymphocytes Relative: 25.8 % (ref 12.0–46.0)
Neutro Abs: 5.8 10*3/uL (ref 1.4–7.7)
Neutrophils Relative %: 63.3 % (ref 43.0–77.0)
RBC: 5.45 Mil/uL (ref 4.22–5.81)
WBC: 9.2 10*3/uL (ref 4.5–10.5)

## 2012-05-03 LAB — BASIC METABOLIC PANEL
BUN: 53 mg/dL — ABNORMAL HIGH (ref 6–23)
CO2: 23 mEq/L (ref 19–32)
Chloride: 105 mEq/L (ref 96–112)
Creatinine, Ser: 2.4 mg/dL — ABNORMAL HIGH (ref 0.4–1.5)
Glucose, Bld: 112 mg/dL — ABNORMAL HIGH (ref 70–99)

## 2012-05-03 LAB — HEPATIC FUNCTION PANEL
ALT: 30 U/L (ref 0–53)
AST: 21 U/L (ref 0–37)
Alkaline Phosphatase: 94 U/L (ref 39–117)
Bilirubin, Direct: 0.1 mg/dL (ref 0.0–0.3)
Total Bilirubin: 0.7 mg/dL (ref 0.3–1.2)
Total Protein: 7.1 g/dL (ref 6.0–8.3)

## 2012-05-03 LAB — LIPID PANEL
Cholesterol: 144 mg/dL (ref 0–200)
LDL Cholesterol: 71 mg/dL (ref 0–99)
VLDL: 39.6 mg/dL (ref 0.0–40.0)

## 2012-05-04 ENCOUNTER — Other Ambulatory Visit: Payer: BC Managed Care – PPO

## 2012-05-05 NOTE — Progress Notes (Signed)
Quick Note:  Pt informed and he has seen a nutritionist and is "major serious about weight loss, has already lost 5 lbs and feels better already. My goal is 60 lbs total, and hopes to be down 30 lbs by his 3 month ROV". Pt wanted you to know. ______

## 2012-05-17 ENCOUNTER — Other Ambulatory Visit: Payer: Self-pay | Admitting: Cardiology

## 2012-07-13 ENCOUNTER — Other Ambulatory Visit: Payer: Self-pay | Admitting: *Deleted

## 2012-07-13 MED ORDER — CLOPIDOGREL BISULFATE 75 MG PO TABS: 75.0000 mg | ORAL_TABLET | Freq: Every day | ORAL | Status: DC

## 2012-07-13 NOTE — Telephone Encounter (Signed)
CALLED PT TO TRY TO MAKE AN APPOINTMENT WITH HIM BECAUSE HE HAS NOT BEEN IN OFFICE SINCE 2012. PT STATES HE WILL CALL OFFICE BACK. NEED APPOINTMENT. Fax Received. Refill Completed. Mattison Golay Chowoe (R.M.A)

## 2012-10-28 ENCOUNTER — Other Ambulatory Visit: Payer: Self-pay | Admitting: Cardiology

## 2012-11-15 ENCOUNTER — Ambulatory Visit (INDEPENDENT_AMBULATORY_CARE_PROVIDER_SITE_OTHER): Payer: BC Managed Care – PPO | Admitting: Cardiology

## 2012-11-15 ENCOUNTER — Encounter: Payer: Self-pay | Admitting: Cardiology

## 2012-11-15 VITALS — BP 138/72 | HR 81 | Ht 71.0 in | Wt 232.0 lb

## 2012-11-15 DIAGNOSIS — E785 Hyperlipidemia, unspecified: Secondary | ICD-10-CM

## 2012-11-15 DIAGNOSIS — I10 Essential (primary) hypertension: Secondary | ICD-10-CM

## 2012-11-15 DIAGNOSIS — N259 Disorder resulting from impaired renal tubular function, unspecified: Secondary | ICD-10-CM

## 2012-11-15 DIAGNOSIS — I2510 Atherosclerotic heart disease of native coronary artery without angina pectoris: Secondary | ICD-10-CM

## 2012-11-15 NOTE — Assessment & Plan Note (Signed)
Continue statin. Lipids and liver monitored by primary care. 

## 2012-11-15 NOTE — Assessment & Plan Note (Signed)
Blood pressure controlled. Continue present medications. 

## 2012-11-15 NOTE — Assessment & Plan Note (Signed)
Followed by nephrology. 

## 2012-11-15 NOTE — Patient Instructions (Signed)
Your physician wants you to follow-up in: 1 year with Dr. Crenshaw. You will receive a reminder letter in the mail two months in advance. If you don't receive a letter, please call our office to schedule the follow-up appointment.  Your physician recommends that you continue on your current medications as directed. Please refer to the Current Medication list given to you today.  

## 2012-11-15 NOTE — Progress Notes (Signed)
HPI: Pleasant gentleman for f/u CAD. Cardiac catheterization was performed on June 12, 2009 following NSTEMI. This revealed normal left main. The LAD had a 70% narrowing just before the large diagonal branch and 70-80% narrowing just after the diagonal branch. There was also 70-80% stenosis in the proximal portion of the first large diagonal branch. The second marginal branch had a 90% ostial stenosis. There was also some 50% narrowing in the AV circumflex at that bifurcation. There was also 90% narrowing in the distal circumflex artery. The right coronary artery was a small to moderate sized vessel that gave rise to 2 right ventricular branches, a posterior descending branch and then was completely occluded. There was a posterolateral branch which filled via collaterals from the posterior branch and also filled via collaterals from the circumflex artery. There was 80% narrowing in the distal right coronary artery. There were 95% ostial stenoses in both acute marginal branches. Note an echocardiogram showed normal LV function. Patient underwent coronary artery bypass and graft on Jul 25, 2009. I last saw him in May of 2012. Since then the patient denies any dyspnea on exertion, orthopnea, PND, pedal edema, palpitations, syncope or chest pain.    Current Outpatient Prescriptions  Medication Sig Dispense Refill  . amLODipine (NORVASC) 10 MG tablet TAKE 1 TABLET (10 MG TOTAL) BY MOUTH DAILY.  90 tablet  0  . aspirin 325 MG tablet Take 325 mg by mouth daily.        . clopidogrel (PLAVIX) 75 MG tablet Take 1 tablet (75 mg total) by mouth daily.  30 tablet  0  . colchicine 0.6 MG tablet Take 0.6 mg by mouth as needed.      . diazepam (VALIUM) 10 MG tablet Take 10 mg by mouth every 6 (six) hours as needed. Per Dr Nolen Mu, psychiatrist      . Eszopiclone (ESZOPICLONE) 3 MG TABS Take 3 mg by mouth at bedtime as needed. Take immediately before bedtime       . hydrALAZINE (APRESOLINE) 25 MG tablet TAKE 1  TABLET (25 MG TOTAL) BY MOUTH 3 (THREE) TIMES DAILY.  90 tablet  1  . lamoTRIgine (LAMICTAL) 200 MG tablet Take 200 mg by mouth daily.       . metoprolol succinate (TOPROL-XL) 50 MG 24 hr tablet Take 1 tablet (50 mg total) by mouth daily.  90 tablet  2  . rosuvastatin (CRESTOR) 40 MG tablet Take 1 tablet (40 mg total) by mouth at bedtime.  90 tablet  3  . Vitamin D, Ergocalciferol, (DRISDOL) 50000 UNITS CAPS Take 50,000 Units by mouth every 7 (seven) days. Per kidney doctor       No current facility-administered medications for this visit.     Past Medical History  Diagnosis Date  . Hyperlipidemia   . CAD (coronary artery disease)   . Hypertension   . Homocystinemia   . Obesity   . Diabetes mellitus     type II  . Sleep apnea   . Benign prostatic hypertrophy   . Bipolar affective disorder, mixed, moderate   . Renal insufficiency   . Nephrolithiasis   . Gout   . 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 Ankle arthroscopy    . Cosmetic surgery      chin  . Liposuction    . Nasal septum surgery    . Ankle surgery      Left  . Coronary artery bypass  graft  07/25/2009    History   Social History  . Marital Status: Married    Spouse Name: N/A    Number of Children: N/A  . Years of Education: N/A   Occupational History  . Not on file.   Social History Main Topics  . Smoking status: Never Smoker   . Smokeless tobacco: Not on file  . Alcohol Use: Not on file  . Drug Use: Not on file  . Sexual Activity: Not on file   Other Topics Concern  . Not on file   Social History Narrative  . No narrative on file    ROS: some back pain but no fevers or chills, productive cough, hemoptysis, dysphasia, odynophagia, melena, hematochezia, dysuria, hematuria, rash, seizure activity, orthopnea, PND, pedal edema, claudication. Remaining systems are negative.  Physical Exam: Well-developed well-nourished in no acute distress.   Skin is warm and dry.  HEENT is normal.  Neck is supple.  Chest is clear to auscultation with normal expansion.  Cardiovascular exam is regular rate and rhythm.  Abdominal exam nontender or distended. No masses palpated. Extremities show no edema. neuro grossly intact  ECG sinus rhythm at a rate of 81. Normal axis. First degree AV block. Right bundle branch block.

## 2012-11-15 NOTE — Assessment & Plan Note (Signed)
Continue ASA and statin  

## 2013-03-17 ENCOUNTER — Telehealth: Payer: Self-pay | Admitting: Family Medicine

## 2013-03-17 MED ORDER — PREDNISONE (PAK) 10 MG PO TABS: ORAL_TABLET | ORAL | Status: DC

## 2013-03-17 NOTE — Telephone Encounter (Signed)
Pt states he is having a gout attack in his right hand and right shoulder. Dr Caryl NeverBurchette gives him predniSONE (DELTASONE) 10 MG tablet Pt would like today/ asap pls advise [facility] [address]

## 2013-03-17 NOTE — Telephone Encounter (Signed)
Pt has not been seen since 03/16/12

## 2013-03-17 NOTE — Telephone Encounter (Signed)
Prednisone 10 mg taper: 4-4-4-3-3-2-2   Pt needs office follow up soon regarding chronic medical problems.

## 2013-03-17 NOTE — Telephone Encounter (Signed)
Rx sent to pharmacy and pt is aware. 

## 2013-04-20 ENCOUNTER — Other Ambulatory Visit: Payer: Self-pay | Admitting: Cardiology

## 2013-04-20 ENCOUNTER — Telehealth: Payer: Self-pay | Admitting: *Deleted

## 2013-04-20 ENCOUNTER — Other Ambulatory Visit: Payer: Self-pay | Admitting: *Deleted

## 2013-04-20 MED ORDER — METOPROLOL SUCCINATE ER 50 MG PO TB24: 50.0000 mg | ORAL_TABLET | Freq: Every day | ORAL | Status: DC

## 2013-04-20 MED ORDER — CLOPIDOGREL BISULFATE 75 MG PO TABS: 75.0000 mg | ORAL_TABLET | Freq: Every day | ORAL | Status: DC

## 2013-04-20 MED ORDER — AMLODIPINE BESYLATE 10 MG PO TABS: ORAL_TABLET | ORAL | Status: DC

## 2013-04-20 NOTE — Telephone Encounter (Signed)
refill 

## 2013-08-10 ENCOUNTER — Telehealth: Payer: Self-pay | Admitting: Cardiology

## 2013-08-10 NOTE — Telephone Encounter (Signed)
Spoke with pt, his bp is running 160-140/100-98. He reports he has gained 20 lbs since December. When discussing his medication, he has only been taking the hydralazine once daily. Explained that hydralazine is a 3 x daily medication. He is going to increase the hydralazine to the right dose and in about 5-7 days let us know if his bp is still elevated. Pt agreed with this plan.

## 2013-08-10 NOTE — Telephone Encounter (Signed)
New message    Pt states he is having some issues and need to talk to a nurse.  He would not tell me what he wanted.

## 2013-08-10 NOTE — Telephone Encounter (Signed)
Received request from Nurse fax box, documents faxed for surgical clearance. To: [facility] Fax number: [phone] Attention: 6.3.15/km

## 2013-08-15 ENCOUNTER — Other Ambulatory Visit: Payer: Self-pay | Admitting: Physician Assistant

## 2013-08-25 ENCOUNTER — Encounter (HOSPITAL_BASED_OUTPATIENT_CLINIC_OR_DEPARTMENT_OTHER): Payer: Self-pay

## 2013-08-25 ENCOUNTER — Ambulatory Visit (HOSPITAL_BASED_OUTPATIENT_CLINIC_OR_DEPARTMENT_OTHER): Admit: 2013-08-25 | Payer: Self-pay | Admitting: Orthopedic Surgery

## 2013-08-25 SURGERY — BURSECTOMY, ELBOW
Anesthesia: General | Site: Elbow | Laterality: Left

## 2013-09-08 ENCOUNTER — Other Ambulatory Visit (INDEPENDENT_AMBULATORY_CARE_PROVIDER_SITE_OTHER): Payer: BC Managed Care – PPO

## 2013-09-08 DIAGNOSIS — Z0000 Encounter for general adult medical examination without abnormal findings: Secondary | ICD-10-CM

## 2013-09-08 LAB — CBC WITH DIFFERENTIAL/PLATELET
Basophils Absolute: 0 10*3/uL (ref 0.0–0.1)
Basophils Relative: 0.4 % (ref 0.0–3.0)
Eosinophils Absolute: 0.2 10*3/uL (ref 0.0–0.7)
Eosinophils Relative: 1.8 % (ref 0.0–5.0)
HCT: 41.9 % (ref 39.0–52.0)
Hemoglobin: 13.8 g/dL (ref 13.0–17.0)
LYMPHS PCT: 22.9 % (ref 12.0–46.0)
Lymphs Abs: 2.5 10*3/uL (ref 0.7–4.0)
MCHC: 33 g/dL (ref 30.0–36.0)
MCV: 86.3 fl (ref 78.0–100.0)
MONO ABS: 1 10*3/uL (ref 0.1–1.0)
Monocytes Relative: 9.7 % (ref 3.0–12.0)
Neutro Abs: 7.1 10*3/uL (ref 1.4–7.7)
Neutrophils Relative %: 65.2 % (ref 43.0–77.0)
PLATELETS: 277 10*3/uL (ref 150.0–400.0)
RBC: 4.85 Mil/uL (ref 4.22–5.81)
RDW: 14.4 % (ref 11.5–15.5)
WBC: 10.8 10*3/uL — ABNORMAL HIGH (ref 4.0–10.5)

## 2013-09-08 LAB — HEPATIC FUNCTION PANEL
ALBUMIN: 4.3 g/dL (ref 3.5–5.2)
ALK PHOS: 89 U/L (ref 39–117)
ALT: 26 U/L (ref 0–53)
AST: 19 U/L (ref 0–37)
Bilirubin, Direct: 0.1 mg/dL (ref 0.0–0.3)
Total Bilirubin: 0.6 mg/dL (ref 0.2–1.2)
Total Protein: 6.7 g/dL (ref 6.0–8.3)

## 2013-09-08 LAB — BASIC METABOLIC PANEL
BUN: 43 mg/dL — AB (ref 6–23)
CO2: 25 mEq/L (ref 19–32)
Calcium: 9.9 mg/dL (ref 8.4–10.5)
Chloride: 103 mEq/L (ref 96–112)
Creatinine, Ser: 2.9 mg/dL — ABNORMAL HIGH (ref 0.4–1.5)
GFR: 23.55 mL/min — AB (ref >60.00)
Glucose, Bld: 173 mg/dL — ABNORMAL HIGH (ref 70–99)
Potassium: 4.4 mEq/L (ref 3.5–5.1)
Sodium: 139 mEq/L (ref 135–145)

## 2013-09-08 LAB — LIPID PANEL
CHOLESTEROL: 308 mg/dL — AB (ref 0–200)
HDL: 46.3 mg/dL (ref >39.00)
LDL CALC: 194 mg/dL — AB (ref 0–99)
NonHDL: 261.7
TRIGLYCERIDES: 338 mg/dL — AB (ref 0.0–149.0)
Total CHOL/HDL Ratio: 7
VLDL: 67.6 mg/dL — ABNORMAL HIGH (ref 0.0–40.0)

## 2013-09-08 LAB — TSH: TSH: 5.54 u[IU]/mL — AB (ref 0.35–4.50)

## 2013-09-08 LAB — PSA: PSA: 2.12 ng/mL (ref 0.10–4.00)

## 2013-09-20 ENCOUNTER — Ambulatory Visit (INDEPENDENT_AMBULATORY_CARE_PROVIDER_SITE_OTHER): Payer: BC Managed Care – PPO | Admitting: Family Medicine

## 2013-09-20 ENCOUNTER — Encounter: Payer: Self-pay | Admitting: Family Medicine

## 2013-09-20 VITALS — BP 124/84 | HR 79 | Temp 97.9°F | Ht 70.0 in | Wt 260.0 lb

## 2013-09-20 DIAGNOSIS — N259 Disorder resulting from impaired renal tubular function, unspecified: Secondary | ICD-10-CM

## 2013-09-20 DIAGNOSIS — H832X9 Labyrinthine dysfunction, unspecified ear: Secondary | ICD-10-CM

## 2013-09-20 DIAGNOSIS — G4700 Insomnia, unspecified: Secondary | ICD-10-CM

## 2013-09-20 DIAGNOSIS — E039 Hypothyroidism, unspecified: Secondary | ICD-10-CM | POA: Insufficient documentation

## 2013-09-20 DIAGNOSIS — E785 Hyperlipidemia, unspecified: Secondary | ICD-10-CM

## 2013-09-20 DIAGNOSIS — H832X3 Labyrinthine dysfunction, bilateral: Secondary | ICD-10-CM

## 2013-09-20 DIAGNOSIS — F5104 Psychophysiologic insomnia: Secondary | ICD-10-CM

## 2013-09-20 DIAGNOSIS — Z0000 Encounter for general adult medical examination without abnormal findings: Secondary | ICD-10-CM

## 2013-09-20 DIAGNOSIS — I10 Essential (primary) hypertension: Secondary | ICD-10-CM

## 2013-09-20 MED ORDER — ESZOPICLONE 3 MG PO TABS: 3.0000 mg | ORAL_TABLET | Freq: Every evening | ORAL | Status: DC | PRN

## 2013-09-20 MED ORDER — LEVOTHYROXINE SODIUM 25 MCG PO TABS: 25.0000 ug | ORAL_TABLET | Freq: Every day | ORAL | Status: DC

## 2013-09-20 MED ORDER — ROSUVASTATIN CALCIUM 40 MG PO TABS: 40.0000 mg | ORAL_TABLET | Freq: Every day | ORAL | Status: DC

## 2013-09-20 MED ORDER — HYDRALAZINE HCL 25 MG PO TABS: 25.0000 mg | ORAL_TABLET | Freq: Three times a day (TID) | ORAL | Status: DC

## 2013-09-20 NOTE — Patient Instructions (Signed)
Set up followup appointment with nephrology Get back on daily use of Crestor Check on insurance coverage for shingles vaccine We will call you regarding colonoscopy Call set up followup with physical therapy for balance specialist

## 2013-09-20 NOTE — Progress Notes (Signed)
Pre visit review using our clinic review tool, if applicable. No additional management support is needed unless otherwise documented below in the visit note. 

## 2013-09-20 NOTE — Progress Notes (Signed)
Subjective:    Patient ID: [name], male    DOB: 1950/12/12, 63 y.o.   MRN: [id number]  HPI Patient here for complete physical and medical follow up/evaluation regarding multiple issues.  He has multiple chronic problems and history of poor compliance with followup. He had significant issues during the past year with depression and has history of bipolar disorder. He is followed by psychiatry and relatively stable this time.  He's had history of cerebrovascular disease, coronary artery disease, type 2 diabetes, gout, hyperlipidemia, hypertension, chronic insomnia, history of kidney stones, obesity, chronic kidney disease. He had recent excision of left olecranon bursa secondary to tophi from gout.  No history of shingles vaccine. Has never had screening colonoscopy. Needs refills of several medications. He has been out of his Crestor. Requesting refills of Lunesta and hydralazine. He's been lost to followup from renal and needs to get back to them.  He also complains of frequent vestibular problems. This is noted particularly playing tennis. He has intermittent vertigo symptoms. No syncope. Frequently feels off balance. He had Wallenberg stroke several years ago and has had balance issues since then. He would like to consider getting back into some physical therapy for balance issues.  Past Medical History  Diagnosis Date  . Hyperlipidemia   . CAD (coronary artery disease)   . Hypertension   . Homocystinemia   . Obesity   . Diabetes mellitus     type II  . Sleep apnea   . Benign prostatic hypertrophy   . Bipolar affective disorder, mixed, moderate   . Renal insufficiency   . Nephrolithiasis   . Gout   . 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 Ankle arthroscopy    . Cosmetic surgery      chin  . Liposuction    . Nasal septum surgery    . Ankle surgery      Left  . Coronary artery bypass graft  07/25/2009    reports that he has never smoked. He does not have any smokeless tobacco history on file. His alcohol and drug histories are not on file. family history includes Cancer in his mother; Depression in his mother; Diabetes in his father; Heart attack in his father. Allergies  Allergen Reactions  . Nsaids       Review of Systems  Constitutional: Positive for fatigue. Negative for fever, appetite change and unexpected weight change.  HENT: Negative for congestion and trouble swallowing.   Respiratory: Negative for cough and shortness of breath.   Cardiovascular: Negative for chest pain, palpitations and leg swelling.  Gastrointestinal: Negative for nausea, vomiting, abdominal pain, diarrhea, constipation and blood in stool.  Endocrine: Negative for polydipsia and polyuria.  Genitourinary: Negative for dysuria.  Neurological: Negative for dizziness, seizures, syncope and weakness.  Hematological: Negative for adenopathy.  Psychiatric/Behavioral: Positive for dysphoric mood.       Objective:   Physical Exam  Constitutional: He is oriented to person, place, and time. He appears well-developed and well-nourished.  HENT:  Mouth/Throat: Oropharynx is clear and moist.  Neck: Neck supple. No thyromegaly present.  Cardiovascular: Normal rate.   Pulmonary/Chest: Effort normal and breath sounds normal. No respiratory distress. He has no wheezes. He has no rales.  Abdominal: Soft. Bowel sounds are normal. He exhibits no distension and no mass. There is no tenderness. There is no rebound and no guarding.  Musculoskeletal: He exhibits no edema.  Lymphadenopathy:    He  has no cervical adenopathy.  Neurological: He is alert and oriented to person, place, and time. No cranial nerve deficit.  Skin: No rash noted.  Psychiatric: He has a normal mood and affect. His behavior is normal.          Assessment & Plan:  #1 complete physical. Labs reviewed. He has very elevated lipids. Get back on  Crestor. Check on coverage for shingles vaccine. Set up screening colonoscopy. Tetanus up to date #2 hypothyroidism. Currently untreated. He does have some progressive fatigue and recent weight gains. Start low-dose levothyroxin 25 mg once daily. Reassess TSH in 3 months at followup #3 severe dyslipidemia as above. Cholesterol over 300. Start back Crestor 40 mg once daily and repeat lipids at followup #4 history of chronic insomnia. Refill Lunesta #5 chronic kidney disease. He is encouraged to followup with nephrology. #6 history of vestibular issues following cerebrovascular accident with recent balance issues. Set up physical therapy for further evaluation

## 2013-09-21 ENCOUNTER — Telehealth: Payer: Self-pay | Admitting: Family Medicine

## 2013-09-21 NOTE — Telephone Encounter (Signed)
Relevant patient education assigned to patient using Emmi. ° °

## 2013-11-01 ENCOUNTER — Telehealth: Payer: Self-pay | Admitting: Internal Medicine

## 2013-11-01 NOTE — Telephone Encounter (Signed)
Ok with me 

## 2013-11-01 NOTE — Telephone Encounter (Signed)
Spoke with patient and he saw Dr. Juanda Chance back in 2003 at the hospital for a consult. He is trying to set up his first screening colonoscopy. He can only do Tues or Thursday due to work schedule. He is willing to change MD's if needed to do this. Please, advise.

## 2013-11-01 NOTE — Telephone Encounter (Signed)
Ok to transfer care to Dr. Rhea Belton.

## 2013-11-01 NOTE — Telephone Encounter (Signed)
OK to switch. I know him well and he may feel more comfortable with a male MD

## 2013-11-01 NOTE — Telephone Encounter (Signed)
Patient is asking to switch from Dr. Juanda Chance, she agrees. Dr. Rhea Belton, will you accept this patient.Please, advise.

## 2013-11-02 ENCOUNTER — Encounter: Payer: Self-pay | Admitting: Internal Medicine

## 2013-11-02 NOTE — Telephone Encounter (Signed)
November Calendar became available today. Since Dr Juanda Chance had availability on Tuesday and he never really wanted to switch providers I went ahead and scheduled him with Dr Juanda Chance. Mr Grimsley was very happy.

## 2013-11-28 ENCOUNTER — Telehealth: Payer: Self-pay | Admitting: Family Medicine

## 2013-11-28 MED ORDER — PREDNISONE (PAK) 10 MG PO TABS: ORAL_TABLET | ORAL | Status: DC

## 2013-11-28 NOTE — Telephone Encounter (Signed)
May refill the prednisone.  We cannot call in any pain meds secondary to restrictions with hydrocodone and oxycodone.

## 2013-11-28 NOTE — Telephone Encounter (Signed)
Pt called to say he is having a gout attack On his right hand. He is asking if  Dr Caryl Never can rx him some  predniSONE (STERAPRED UNI-PAK) 10 MG tablet and pain medicine  Pharmacy; [facility]

## 2013-11-28 NOTE — Telephone Encounter (Signed)
RX sent to pharmacy  

## 2013-12-19 ENCOUNTER — Telehealth: Payer: Self-pay | Admitting: Family Medicine

## 2013-12-19 MED ORDER — HYDROCODONE-HOMATROPINE 5-1.5 MG/5ML PO SYRP: 5.0000 mL | ORAL_SOLUTION | Freq: Four times a day (QID) | ORAL | Status: DC | PRN

## 2013-12-19 NOTE — Telephone Encounter (Signed)
See if he has tolerated hydrocodone cough suppressants in the past.  If so, can do Hycodan cough syrup one tsp po q 6 hours prn cough #120 ml  And make sure he knows NOT to take with other pain meds and only at night or when he will not be driving for at least 6 hours.

## 2013-12-19 NOTE — Telephone Encounter (Signed)
Called pt to r/s appt scheduled for this Thursday, 12/22/13 due to pcp being out of office.  Pt states he has had a severe cough for the last 3 days along with nasal congestion.  Pt has been taking Muccinex OTC but with no relief and has not been able to sleep well at night due to cough.  Pt is requesting rx for cough medicine to be called in [facility].

## 2013-12-19 NOTE — Telephone Encounter (Signed)
Pt is okay with taking Hycodan. Pt is aware that RX ready for pickup

## 2013-12-22 ENCOUNTER — Ambulatory Visit: Payer: BC Managed Care – PPO | Admitting: Family Medicine

## 2013-12-28 ENCOUNTER — Telehealth: Payer: Self-pay | Admitting: *Deleted

## 2013-12-28 NOTE — Telephone Encounter (Signed)
Pt is scheduled for pre-visit 12/29/13 at 830 am for recall colonoscopy on 01/10/14 with [facility], pt is currently taking plavix and will need a new pt appt with an extender so that we may address the blood thinner, called pt and left message that we need to set up new appt and to call us back to do so-adm

## 2014-01-10 ENCOUNTER — Encounter: Payer: BC Managed Care – PPO | Admitting: Internal Medicine

## 2014-01-12 ENCOUNTER — Encounter: Payer: Self-pay | Admitting: Family Medicine

## 2014-01-12 ENCOUNTER — Ambulatory Visit (INDEPENDENT_AMBULATORY_CARE_PROVIDER_SITE_OTHER): Payer: BC Managed Care – PPO

## 2014-01-12 ENCOUNTER — Ambulatory Visit (INDEPENDENT_AMBULATORY_CARE_PROVIDER_SITE_OTHER): Payer: BC Managed Care – PPO | Admitting: Family Medicine

## 2014-01-12 VITALS — BP 140/90 | HR 77 | Temp 98.1°F

## 2014-01-12 DIAGNOSIS — E785 Hyperlipidemia, unspecified: Secondary | ICD-10-CM

## 2014-01-12 DIAGNOSIS — I10 Essential (primary) hypertension: Secondary | ICD-10-CM

## 2014-01-12 DIAGNOSIS — E1165 Type 2 diabetes mellitus with hyperglycemia: Secondary | ICD-10-CM

## 2014-01-12 DIAGNOSIS — E039 Hypothyroidism, unspecified: Secondary | ICD-10-CM

## 2014-01-12 DIAGNOSIS — E119 Type 2 diabetes mellitus without complications: Secondary | ICD-10-CM

## 2014-01-12 DIAGNOSIS — Z23 Encounter for immunization: Secondary | ICD-10-CM

## 2014-01-12 LAB — BASIC METABOLIC PANEL
BUN: 34 mg/dL — ABNORMAL HIGH (ref 6–23)
CHLORIDE: 105 meq/L (ref 96–112)
CO2: 24 mEq/L (ref 19–32)
Calcium: 8.9 mg/dL (ref 8.4–10.5)
Creatinine, Ser: 3 mg/dL — ABNORMAL HIGH (ref 0.4–1.5)
GFR: 22.89 mL/min — ABNORMAL LOW (ref >60.00)
Glucose, Bld: 227 mg/dL — ABNORMAL HIGH (ref 70–99)
POTASSIUM: 4.3 meq/L (ref 3.5–5.1)
SODIUM: 138 meq/L (ref 135–145)

## 2014-01-12 LAB — LIPID PANEL
Cholesterol: 129 mg/dL (ref 0–200)
HDL: 29.4 mg/dL — AB (ref >39.00)
LDL Cholesterol: 66 mg/dL (ref 0–99)
NONHDL: 99.6
TRIGLYCERIDES: 167 mg/dL — AB (ref 0.0–149.0)
Total CHOL/HDL Ratio: 4
VLDL: 33.4 mg/dL (ref 0.0–40.0)

## 2014-01-12 LAB — HEMOGLOBIN A1C: Hgb A1c MFr Bld: 10.6 % — ABNORMAL HIGH (ref 4.6–6.5)

## 2014-01-12 LAB — TSH: TSH: 5.93 u[IU]/mL — ABNORMAL HIGH (ref 0.35–4.50)

## 2014-01-12 NOTE — Progress Notes (Signed)
Pre visit review using our clinic review tool, if applicable. No additional management support is needed unless otherwise documented below in the visit note. 

## 2014-01-12 NOTE — Progress Notes (Signed)
Subjective:    Patient ID: [name], male    DOB: 1950-10-19, 63 y.o.   MRN: [id number]  HPI Patient seen for medical follow-up. He has very complicated past medical history. He has history of CAD, obesity, type 2 diabetes, dyslipidemia, hypertension, bipolar, BPH, obstructive sleep apnea, hypothyroidism, chronic insomnia. He's had recent respiratory issues. About 5 weeks of cough. Went to urgent care and treated with Levaquin and prednisone and Hycodan cough syrup. Cough is gradually improving. Overall feels better. No recent fever. Nonsmoker.  Hypothyroidism and we had recommended starting low-dose thyroid replacement last visit but he never started this. He does have some chronic fatigue issues.  Hyperlipidemia. Recent cholesterol over 300 and patient was not taking his Crestor. He is back on this diligently. Needs follow-up lipids today. Patient did have one episode of chest pain back in August and has been reluctant to get back to his regular exercise habits since then. He has not been playing tennis or walking much. He plans to start back soon. No recent chest pains. He has declined follow-up with cardiology at this time  Past Medical History  Diagnosis Date  . Hyperlipidemia   . CAD (coronary artery disease)   . Hypertension   . Homocystinemia   . Obesity   . Diabetes mellitus     type II  . Sleep apnea   . Benign prostatic hypertrophy   . Bipolar affective disorder, mixed, moderate   . Renal insufficiency   . Nephrolithiasis   . Gout   . 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 Ankle arthroscopy    . Cosmetic surgery      chin  . Liposuction    . Nasal septum surgery    . Ankle surgery      Left  . Coronary artery bypass graft  07/25/2009    reports that he has never smoked. He does not have any smokeless tobacco history on file. His alcohol and drug histories are not on file. family history includes  Cancer in his mother; Depression in his mother; Diabetes in his father; Heart attack in his father. Allergies  Allergen Reactions  . Nsaids       Review of Systems  Constitutional: Positive for fatigue.  Eyes: Negative for visual disturbance.  Respiratory: Positive for cough. Negative for chest tightness, shortness of breath and wheezing.   Cardiovascular: Negative for chest pain, palpitations and leg swelling.  Gastrointestinal: Negative for abdominal pain.  Endocrine: Negative for polydipsia and polyuria.  Neurological: Negative for dizziness, syncope, weakness, light-headedness and headaches.       Objective:   Physical Exam  Constitutional: He appears well-developed and well-nourished. No distress.  HENT:  Mouth/Throat: Oropharynx is clear and moist.  Neck: Neck supple. No thyromegaly present.  Cardiovascular: Normal rate and regular rhythm.   Pulmonary/Chest: Effort normal and breath sounds normal. No respiratory distress. He has no wheezes. He has no rales.  Musculoskeletal: He exhibits no edema.          Assessment & Plan:  #1 dyslipidemia. Recheck lipid panel today.  He is back on Crestor. #2 hypothyroidism. He declined starting thyroid medication but does agree to recheck TSH and if this is worsening he will consider going on low-dose Synthroid #3 type 2 diabetes. Not monitored closely. He does not have home monitor and does not wish to monitor at home. Check A1c. We'll probably need insulin if this is still elevated as  he is not a candidate for a lot of oral medications because of his chronic kidney disease #4 history of CAD. Patient has declined further cardiology follow-up since chest pain episode in August but is strongly urged to go back for any recurrent chest pain especially with activities. #5 recent cough. Overall this is improving. Suspect residual from recent acute bronchitis.Nonfocal exam. Touch base if this is not resolving over the next couple weeks.

## 2014-01-13 ENCOUNTER — Telehealth: Payer: Self-pay | Admitting: Family Medicine

## 2014-01-13 NOTE — Telephone Encounter (Signed)
emmi emailed °

## 2014-01-17 ENCOUNTER — Other Ambulatory Visit: Payer: Self-pay

## 2014-01-17 MED ORDER — LEVOTHYROXINE SODIUM 25 MCG PO TABS: 25.0000 ug | ORAL_TABLET | Freq: Every day | ORAL | Status: DC

## 2014-03-17 ENCOUNTER — Other Ambulatory Visit: Payer: Self-pay | Admitting: *Deleted

## 2014-03-17 MED ORDER — METOPROLOL SUCCINATE ER 50 MG PO TB24: 50.0000 mg | ORAL_TABLET | Freq: Every day | ORAL | Status: DC

## 2014-03-30 ENCOUNTER — Ambulatory Visit (INDEPENDENT_AMBULATORY_CARE_PROVIDER_SITE_OTHER): Payer: BLUE CROSS/BLUE SHIELD | Admitting: Family Medicine

## 2014-03-30 ENCOUNTER — Encounter: Payer: Self-pay | Admitting: Family Medicine

## 2014-03-30 VITALS — BP 128/80 | HR 83 | Temp 97.5°F

## 2014-03-30 DIAGNOSIS — R059 Cough, unspecified: Secondary | ICD-10-CM

## 2014-03-30 DIAGNOSIS — K219 Gastro-esophageal reflux disease without esophagitis: Secondary | ICD-10-CM

## 2014-03-30 DIAGNOSIS — R490 Dysphonia: Secondary | ICD-10-CM

## 2014-03-30 DIAGNOSIS — R05 Cough: Secondary | ICD-10-CM

## 2014-03-30 DIAGNOSIS — L989 Disorder of the skin and subcutaneous tissue, unspecified: Secondary | ICD-10-CM

## 2014-03-30 MED ORDER — METOPROLOL SUCCINATE ER 50 MG PO TB24: 50.0000 mg | ORAL_TABLET | Freq: Every day | ORAL | Status: DC

## 2014-03-30 MED ORDER — PANTOPRAZOLE SODIUM 40 MG PO TBEC: 40.0000 mg | DELAYED_RELEASE_TABLET | Freq: Every day | ORAL | Status: DC

## 2014-03-30 MED ORDER — METHYLPREDNISOLONE ACETATE 80 MG/ML IJ SUSP
80.0000 mg | Freq: Once | INTRAMUSCULAR | Status: AC
  Administered 2014-03-30: 80 mg via INTRAMUSCULAR

## 2014-03-30 MED ORDER — HYDROCODONE-HOMATROPINE 5-1.5 MG/5ML PO SYRP: 5.0000 mL | ORAL_SOLUTION | Freq: Four times a day (QID) | ORAL | Status: AC | PRN

## 2014-03-30 NOTE — Progress Notes (Signed)
Subjective:    Patient ID: [name], male    DOB: 07-Apr-1950, 64 y.o.   MRN: [id number]  HPI Patient seen for chronic cough. He's had at least 2 months of cough occasionally productive of clear thick mucus. He notices cough is worse after eating. He has frequent reflux symptoms. Also notices that his cough is frequently worse at night. He has been sleeping propped up with extra pillows for some time. He also has frequent hoarseness which he thinks may be related to the GERD. He denies any dysphagia. No pain with swallowing. He has not taken any acid suppressors. Frequently using mentholated cough drops with minimal if any improvement. No hemoptysis. No appetite changes. No dyspnea. Never smoked  He has multiple chronic problems include obesity, chronic kidney disease, type 2 diabetes, hypothyroidism, hyperlipidemia, gout, hypertension, coronary artery disease, and bipolar disorder. Poor compliance with follow-up. Recently initiated thyroid replacement. Recent A1c over 10%. He is reluctant to start any sort of medication this point. Will need insulin with his chronic kidney disease as he cannot take many oral medications  Past Medical History  Diagnosis Date  . Hyperlipidemia   . CAD (coronary artery disease)   . Hypertension   . Homocystinemia   . Obesity   . Diabetes mellitus     type II  . Sleep apnea   . Benign prostatic hypertrophy   . Bipolar affective disorder, mixed, moderate   . Renal insufficiency   . Nephrolithiasis   . Gout   . 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 Ankle arthroscopy    . Cosmetic surgery      chin  . Liposuction    . Nasal septum surgery    . Ankle surgery      Left  . Coronary artery bypass graft  07/25/2009    reports that he has never smoked. He does not have any smokeless tobacco history on file. His alcohol and drug histories are not on file. family history includes Cancer in his  mother; Depression in his mother; Diabetes in his father; Heart attack in his father. Allergies  Allergen Reactions  . Nsaids       Review of Systems  Constitutional: Negative for fever, chills and unexpected weight change.  HENT: Positive for voice change. Negative for congestion and trouble swallowing.   Respiratory: Positive for cough and wheezing. Negative for shortness of breath.   Cardiovascular: Negative for chest pain.  Genitourinary: Negative for dysuria.  Neurological: Negative for syncope.       Objective:   Physical Exam  Constitutional: He is oriented to person, place, and time. He appears well-developed and well-nourished. No distress.  HENT:  Right Ear: External ear normal.  Left Ear: External ear normal.  Mouth/Throat: Oropharynx is clear and moist.  Neck: Neck supple.  Cardiovascular: Normal rate and regular rhythm.   Pulmonary/Chest: Effort normal and breath sounds normal. No respiratory distress. He has no wheezes. He has no rales.  Musculoskeletal: He exhibits no edema.  Lymphadenopathy:    He has no cervical adenopathy.  Neurological: He is alert and oriented to person, place, and time. No cranial nerve deficit.  Skin: He is not diaphoretic.  Left cheek reveals approximately 5 mm slightly elevated fleshy nodular lesion with slightly umbilicated center          Assessment & Plan:  #1 chronic cough. Suspect GERD related. We've recommended elevation of head of bed. Avoid mentholated  products. Reduce caffeine intake. Dietary handout given on lifestyle modification. Protonix 40 mg once daily. Consider chest x-ray and further evaluation not improving of the next few weeks #2 GERD. As above #3 hoarseness. Probably related #2. If this is not improving with PPI use of a next few weeks consider ENT referral. He has no history of smoking.  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 We discussed  adverse issues such as elevating blood sugar and he is aware. Monitor blood sugars closely #4 probable early basal cell carcinoma left face. We recommended dermatology referral and he wishes to defer until March we will discuss at follow-up visit in #5 type 2 diabetes currently untreated. We again had a long discussion regarding insulin therapy. He is very reluctant to start we've explained the dangers of not controlling his diabetes and he will reschedule follow-up to discuss initiating insulin therapy over the next couple weeks

## 2014-03-30 NOTE — Patient Instructions (Signed)
Food Choices for Gastroesophageal Reflux Disease When you have gastroesophageal reflux disease (GERD), the foods you eat and your eating habits are very important. Choosing the right foods can help ease the discomfort of GERD. WHAT GENERAL GUIDELINES DO I NEED TO FOLLOW?  Choose fruits, vegetables, whole grains, low-fat dairy products, and low-fat meat, fish, and poultry.  Limit fats such as oils, salad dressings, butter, nuts, and avocado.  Keep a food diary to identify foods that cause symptoms.  Avoid foods that cause reflux. These may be different for different people.  Eat frequent small meals instead of three large meals each day.  Eat your meals slowly, in a relaxed setting.  Limit fried foods.  Cook foods using methods other than frying.  Avoid drinking alcohol.  Avoid drinking large amounts of liquids with your meals.  Avoid bending over or lying down until 2-3 hours after eating. WHAT FOODS ARE NOT RECOMMENDED? The following are some foods and drinks that may worsen your symptoms: Vegetables Tomatoes. Tomato juice. Tomato and spaghetti sauce. Chili peppers. Onion and garlic. Horseradish. Fruits Oranges, grapefruit, and lemon (fruit and juice). Meats High-fat meats, fish, and poultry. This includes hot dogs, ribs, ham, sausage, salami, and bacon. Dairy Whole milk and chocolate milk. Sour cream. Cream. Butter. Ice cream. Cream cheese.  Beverages Coffee and tea, with or without caffeine. Carbonated beverages or energy drinks. Condiments Hot sauce. Barbecue sauce.  Sweets/Desserts Chocolate and cocoa. Donuts. Peppermint and spearmint. Fats and Oils High-fat foods, including [place] fries and potato chips. Other Vinegar. Strong spices, such as black pepper, white pepper, red pepper, cayenne, curry powder, cloves, ginger, and chili powder. The items listed above may not be a complete list of foods and beverages to avoid. Contact your dietitian for more  information. Document Released: 02/24/2005 Document Revised: 03/01/2013 Document Reviewed: 12/29/2012 [facility] Patient Information 2015 [place]. This information is not intended to replace advice given to you by your health care provider. Make sure you discuss any questions you have with your health care provider.  Consider elevate head of bed 6-8 inches.

## 2014-03-30 NOTE — Progress Notes (Signed)
Pre visit review using our clinic review tool, if applicable. No additional management support is needed unless otherwise documented below in the visit note. 

## 2014-04-20 ENCOUNTER — Other Ambulatory Visit: Payer: Self-pay

## 2014-04-20 MED ORDER — AMLODIPINE BESYLATE 10 MG PO TABS: ORAL_TABLET | ORAL | Status: DC

## 2014-05-15 ENCOUNTER — Telehealth: Payer: Self-pay | Admitting: Family Medicine

## 2014-05-15 ENCOUNTER — Other Ambulatory Visit: Payer: Self-pay

## 2014-05-15 MED ORDER — METOPROLOL SUCCINATE ER 50 MG PO TB24: 50.0000 mg | ORAL_TABLET | Freq: Every day | ORAL | Status: DC

## 2014-05-15 NOTE — Telephone Encounter (Addendum)
Pt request refill metoprolol succinate (TOPROL-XL) 50 MG 24 hr tablet [facility]  [address] [place] [phone] Fax [phone]  Pt is out of this med and needs ASAP (especially since he has this other issues going on) [place] some symptoms as he had in the past when he had a stroke. Pt refused triage, refused to go to hospital. Would like to know if dr burchette would prefer him to go to neuro [facility] when he gets back or come and see dr burchette Pt has ongoing cough and states dr Caryl Neverburchette has been managing all these things. Prefers his input first Pt will be back to [place] later this week.

## 2014-05-15 NOTE — Telephone Encounter (Signed)
Informed patient per Dr. Leonard SchwartzB. Informed patient that he needs to set up appointment when he returns back to [place] on Wednesday 05/17/14. Pt stated that he will set up appointment next week.  Rx sent to pharmacy.

## 2014-05-15 NOTE — Telephone Encounter (Signed)
Refill metoprolol OK.  We are happy to see him when he gets back.  However, if he has ANY symptoms suggestive of stroke he should seek IMMEDIATE medical care.

## 2014-06-01 ENCOUNTER — Ambulatory Visit (INDEPENDENT_AMBULATORY_CARE_PROVIDER_SITE_OTHER): Payer: BLUE CROSS/BLUE SHIELD | Admitting: Family Medicine

## 2014-06-01 ENCOUNTER — Encounter: Payer: Self-pay | Admitting: Family Medicine

## 2014-06-01 VITALS — BP 130/84 | HR 88 | Temp 97.7°F

## 2014-06-01 DIAGNOSIS — I10 Essential (primary) hypertension: Secondary | ICD-10-CM

## 2014-06-01 DIAGNOSIS — E119 Type 2 diabetes mellitus without complications: Secondary | ICD-10-CM | POA: Diagnosis not present

## 2014-06-01 DIAGNOSIS — R053 Chronic cough: Secondary | ICD-10-CM

## 2014-06-01 DIAGNOSIS — N259 Disorder resulting from impaired renal tubular function, unspecified: Secondary | ICD-10-CM | POA: Diagnosis not present

## 2014-06-01 DIAGNOSIS — E038 Other specified hypothyroidism: Secondary | ICD-10-CM

## 2014-06-01 DIAGNOSIS — E1165 Type 2 diabetes mellitus with hyperglycemia: Secondary | ICD-10-CM

## 2014-06-01 DIAGNOSIS — R0600 Dyspnea, unspecified: Secondary | ICD-10-CM

## 2014-06-01 DIAGNOSIS — R05 Cough: Secondary | ICD-10-CM

## 2014-06-01 MED ORDER — AMLODIPINE BESYLATE 10 MG PO TABS: ORAL_TABLET | ORAL | Status: DC

## 2014-06-01 NOTE — Progress Notes (Signed)
Pre visit review using our clinic review tool, if applicable. No additional management support is needed unless otherwise documented below in the visit note. 

## 2014-06-01 NOTE — Progress Notes (Signed)
   Subjective:    Patient ID: [name], male    DOB: 01/07/1951, 64 y.o.   MRN: [id number]  HPI Patient has multiple chronic problems including obesity, GERD, hypothyroidism, type 2 diabetes, hyperlipidemia, gout, bipolar disorder, hypertension, CAD, chronic kidney disease. History of poor compliance. Seen today for the following:  Chronic cough. Basically since October. Nonsmoker. No hemoptysis. Cough has improved tremendously with addition of Protonix and elevation of head of bed. Still has some daytime cough. No orthopnea. No peripheral edema No wheezing.  No persistent post-nasal drip.  Hypothyroidism. Recent TSH over 5 and symptomatic with fatigue. We initiated low-dose levothyroxine 25 mcgs daily. Needs follow-up lab  Type 2 diabetes. History of poor compliance. Because of chronic kidney disease not a candidate for oral medications. He has refused insulin. Not monitoring blood sugars regularly.  Chronic kidney disease with baseline creatinine around 3.0. No peripheral edema issues.  Past Medical History  Diagnosis Date  . Hyperlipidemia   . CAD (coronary artery disease)   . Hypertension   . Homocystinemia   . Obesity   . Diabetes mellitus     type II  . Sleep apnea   . Benign prostatic hypertrophy   . Bipolar affective disorder, mixed, moderate   . Renal insufficiency   . Nephrolithiasis   . Gout   . 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 Ankle arthroscopy    . Cosmetic surgery      chin  . Liposuction    . Nasal septum surgery    . Ankle surgery      Left  . Coronary artery bypass graft  07/25/2009    reports that he has never smoked. He does not have any smokeless tobacco history on file. His alcohol and drug histories are not on file. family history includes Cancer in his mother; Depression in his mother; Diabetes in his father; Heart attack in his father. Allergies  Allergen Reactions  . Nsaids         Review of Systems  Constitutional: Positive for fatigue. Negative for appetite change and unexpected weight change.  Eyes: Negative for visual disturbance.  Respiratory: Positive for cough. Negative for chest tightness and shortness of breath.   Cardiovascular: Negative for chest pain, palpitations and leg swelling.  Genitourinary: Negative for dysuria.  Neurological: Negative for dizziness, syncope, weakness, light-headedness and headaches.       Objective:   Physical Exam  Constitutional: He appears well-developed and well-nourished.  Neck: Neck supple. No JVD present.  Cardiovascular: Normal rate and regular rhythm.   Pulmonary/Chest: Effort normal and breath sounds normal. No respiratory distress. He has no wheezes. He has no rales.  Musculoskeletal: He exhibits no edema.  Psychiatric: He has a normal mood and affect. His behavior is normal.          Assessment & Plan:  #1 hypothyroidism. Recheck TSH. Recent initiation of low-dose levothyroxin #2 chronic cough. Improved with PPI addition and elevation of head of bed. Still has some dry cough. Obtain chest x-ray. Check BNP level-if unrevealing and normal consider pulmonary referral #3 chronic kidney disease. Recheck basic metabolic panel  #4 type 2 diabetes. We strongly advocated starting insulin. He wishes to lose some weight and reassess in 2 months. He declined A1c today.  We have been trying to convince him to start insulin for some time.

## 2014-06-08 ENCOUNTER — Other Ambulatory Visit: Payer: Self-pay | Admitting: Family Medicine

## 2014-07-26 ENCOUNTER — Telehealth: Payer: Self-pay | Admitting: Family Medicine

## 2014-07-26 MED ORDER — PREDNISONE 20 MG PO TABS: 20.0000 mg | ORAL_TABLET | Freq: Two times a day (BID) | ORAL | Status: DC

## 2014-07-26 NOTE — Telephone Encounter (Signed)
Rx sent to pharmacy. Left message on VM about blood sugars.

## 2014-07-26 NOTE — Telephone Encounter (Signed)
Yes.  Prednisone 20 mg po bid for 5 days and monitor blood sugars closely.

## 2014-07-26 NOTE — Telephone Encounter (Signed)
Pt called to say his gout is bothering him. He call to ask if Dr Caryl NeverBurchette will rx him some PREDNISONE.    [facility] [address]

## 2014-08-02 ENCOUNTER — Ambulatory Visit: Payer: BLUE CROSS/BLUE SHIELD | Admitting: Family Medicine

## 2014-08-25 ENCOUNTER — Other Ambulatory Visit: Payer: Self-pay

## 2014-08-25 MED ORDER — METOPROLOL SUCCINATE ER 50 MG PO TB24
50.0000 mg | ORAL_TABLET | Freq: Every day | ORAL | Status: DC
Start: 2014-08-25 — End: 2015-06-10

## 2014-09-14 ENCOUNTER — Other Ambulatory Visit: Payer: Self-pay | Admitting: Family Medicine

## 2014-09-14 ENCOUNTER — Encounter: Payer: Self-pay | Admitting: *Deleted

## 2014-09-14 DIAGNOSIS — E119 Type 2 diabetes mellitus without complications: Secondary | ICD-10-CM

## 2014-09-28 ENCOUNTER — Telehealth: Payer: Self-pay | Admitting: Cardiology

## 2014-09-28 NOTE — Telephone Encounter (Signed)
Ok to hold asa and plavix for procedure; needs fu ov Olga Millers

## 2014-09-28 NOTE — Telephone Encounter (Signed)
New Message  Request for surgical clearance:  1. What type of surgery is being performed? Epid injection    2. When is this surgery scheduled?  N/a   3. Are there any medications that need to be held prior to surgery and how long? Plavix/ Aspirin for 7 days (if possible)- (Pt has not taken plavix for 10 days as of 7/21)  4. Name of physician performing surgery? Ibazebo   5. What is your office phone and fax number? [phone], fax [phone].

## 2014-09-28 NOTE — Telephone Encounter (Signed)
Forward to Dr Crenshaw, and Debra RN 

## 2014-09-28 NOTE — Telephone Encounter (Signed)
Note will be faxed to [phone].

## 2014-10-12 ENCOUNTER — Other Ambulatory Visit: Payer: Self-pay | Admitting: *Deleted

## 2014-10-12 MED ORDER — CLOPIDOGREL BISULFATE 75 MG PO TABS: 75.0000 mg | ORAL_TABLET | Freq: Every day | ORAL | Status: DC

## 2014-11-28 NOTE — Progress Notes (Signed)
HPI: FU CAD. Cardiac catheterization was performed on June 12, 2009 following NSTEMI and revealed severe CAD. Note an echocardiogram showed normal LV function. Patient underwent coronary artery bypass and graft on Jul 25, 2009. He has a liima to the LAD, saphenous vein graft to the diagonal, sequential saphenous vein graft to the second and third marginals and a saphenous vein graft to the acute marginal. Since I last saw him in Sept 2014, patient has had some issues with chest pain. Approximately 15 months ago he states he had severe chest and jaw pain for approximately 2 hours. He did not seek medical assistance at that time. Since then he has noticed increased dyspnea on exertion. It has improved recently but he is not back to baseline compared to his last office visit. There is no orthopnea, PND or pedal edema. He has occasional vague non-exertional chest pain. He can walk 15 minutes without having chest pain. He has not had syncope.   Current Outpatient Prescriptions  Medication Sig Dispense Refill  . amLODipine (NORVASC) 10 MG tablet TAKE 1 TABLET (10 MG TOTAL) BY MOUTH DAILY. 90 tablet 3  . aspirin 325 MG tablet Take 325 mg by mouth daily.      . diazepam (VALIUM) 10 MG tablet Take 10 mg by mouth every 6 (six) hours as needed. Per Dr Nolen Mu, psychiatrist    . lamoTRIgine (LAMICTAL) 200 MG tablet Take 200 mg by mouth daily.     . metoprolol succinate (TOPROL-XL) 50 MG 24 hr tablet Take 1 tablet (50 mg total) by mouth daily. Take with or immediately following a meal. 30 tablet 0  . rosuvastatin (CRESTOR) 40 MG tablet Take 1 tablet (40 mg total) by mouth at bedtime. 90 tablet 3  . clopidogrel (PLAVIX) 75 MG tablet Take 1 tablet (75 mg total) by mouth daily. (Patient not taking: Reported on 11/30/2014) 30 tablet 0  . colchicine 0.6 MG tablet Take 0.6 mg by mouth as needed.    . Eszopiclone (ESZOPICLONE) 3 MG TABS Take 1 tablet (3 mg total) by mouth at bedtime as needed. Take immediately  before bedtime (Patient not taking: Reported on 11/30/2014) 30 tablet 5  . hydrALAZINE (APRESOLINE) 25 MG tablet Take 1 tablet (25 mg total) by mouth 3 (three) times daily. 270 tablet 3  . levothyroxine (LEVOTHROID) 25 MCG tablet Take 1 tablet (25 mcg total) by mouth daily before breakfast. (Patient not taking: Reported on 11/30/2014) 90 tablet 1  . oxyCODONE-acetaminophen (PERCOCET/ROXICET) 5-325 MG per tablet Take 1-2 tablets by mouth every 8 (eight) hours as needed.     . pantoprazole (PROTONIX) 40 MG tablet Take 1 tablet (40 mg total) by mouth daily. (Patient not taking: Reported on 11/30/2014) 30 tablet 6  . predniSONE (DELTASONE) 20 MG tablet Take 1 tablet (20 mg total) by mouth 2 (two) times daily with a meal. (Patient not taking: Reported on 11/30/2014) 10 tablet 0  . Vitamin D, Ergocalciferol, (DRISDOL) 50000 UNITS CAPS Take 50,000 Units by mouth every 7 (seven) days. Per kidney doctor     No current facility-administered medications for this visit.     Past Medical History  Diagnosis Date  . Hyperlipidemia   . CAD (coronary artery disease)   . Hypertension   . Homocystinemia   . Obesity   . Diabetes mellitus     type II  . Sleep apnea   . Benign prostatic hypertrophy   . Bipolar affective disorder, mixed, moderate   . Renal insufficiency   .  Nephrolithiasis   . Gout   . 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 Ankle arthroscopy    . Cosmetic surgery      chin  . Liposuction    . Nasal septum surgery    . Ankle surgery      Left  . Coronary artery bypass graft  07/25/2009    Social History   Social History  . Marital Status: Married    Spouse Name: N/A  . Number of Children: N/A  . Years of Education: N/A   Occupational History  . Not on file.   Social History Main Topics  . Smoking status: Never Smoker   . Smokeless tobacco: Not on file  . Alcohol Use: Not on file  . Drug Use: Not on file  . Sexual  Activity: Not on file   Other Topics Concern  . Not on file   Social History Narrative    ROS: no fevers or chills, productive cough, hemoptysis, dysphasia, odynophagia, melena, hematochezia, dysuria, hematuria, rash, seizure activity, orthopnea, PND, pedal edema, claudication. Remaining systems are negative.  Physical Exam: Well-developed well-nourished in no acute distress.  Skin is warm and dry.  HEENT is normal.  Neck is supple.  Chest is clear to auscultation with normal expansion.  Cardiovascular exam is regular rate and rhythm.  Abdominal exam nontender or distended. No masses palpated. Extremities show trace edema. neuro grossly intact  ECG sinus rhythm with first-degree AV block. Incomplete right bundle branch block. No ST changes.

## 2014-11-30 ENCOUNTER — Ambulatory Visit (INDEPENDENT_AMBULATORY_CARE_PROVIDER_SITE_OTHER): Payer: BLUE CROSS/BLUE SHIELD | Admitting: Cardiology

## 2014-11-30 ENCOUNTER — Encounter: Payer: Self-pay | Admitting: Cardiology

## 2014-11-30 ENCOUNTER — Encounter: Payer: Self-pay | Admitting: *Deleted

## 2014-11-30 VITALS — BP 170/108 | HR 81 | Ht 70.0 in

## 2014-11-30 DIAGNOSIS — R072 Precordial pain: Secondary | ICD-10-CM | POA: Diagnosis not present

## 2014-11-30 DIAGNOSIS — E785 Hyperlipidemia, unspecified: Secondary | ICD-10-CM

## 2014-11-30 DIAGNOSIS — I10 Essential (primary) hypertension: Secondary | ICD-10-CM | POA: Diagnosis not present

## 2014-11-30 DIAGNOSIS — I25118 Atherosclerotic heart disease of native coronary artery with other forms of angina pectoris: Secondary | ICD-10-CM | POA: Insufficient documentation

## 2014-11-30 DIAGNOSIS — I2510 Atherosclerotic heart disease of native coronary artery without angina pectoris: Secondary | ICD-10-CM | POA: Diagnosis not present

## 2014-11-30 DIAGNOSIS — R079 Chest pain, unspecified: Secondary | ICD-10-CM

## 2014-11-30 DIAGNOSIS — R0600 Dyspnea, unspecified: Secondary | ICD-10-CM

## 2014-11-30 DIAGNOSIS — N259 Disorder resulting from impaired renal tubular function, unspecified: Secondary | ICD-10-CM

## 2014-11-30 MED ORDER — AMLODIPINE BESYLATE 10 MG PO TABS: ORAL_TABLET | ORAL | Status: DC

## 2014-11-30 MED ORDER — HYDRALAZINE HCL 25 MG PO TABS: 25.0000 mg | ORAL_TABLET | Freq: Three times a day (TID) | ORAL | Status: DC

## 2014-11-30 NOTE — Assessment & Plan Note (Signed)
Patient has had occasional chest pain. He had significant chest and jaw pain approximately 15 months ago and did not seek medical attention. He states "I am convinced I had a heart attack". He also has noticed increased dyspnea on exertion. Plan to proceed with stress nuclear study. Note he has significant renal insufficiency and we will to obtain his most recent laboratories from primary care. He would be at significant risk for contrast nephropathy if he ever requires repeat catheterization. This could even include dialysis. I discussed this with him today and he would still want to proceed with catheterization if nuclear study is abnormal.

## 2014-11-30 NOTE — Assessment & Plan Note (Signed)
Blood pressure is elevated. However he is not taking his Norvasc. He is also not taking hydralazine that was previously prescribed. He will resume Norvasc 10 mg daily and hydralazine 25 mg by mouth 3 times a day. We will increase this as needed in the future.

## 2014-11-30 NOTE — Assessment & Plan Note (Signed)
Continue aspirin and statin. 

## 2014-11-30 NOTE — Patient Instructions (Signed)
Your physician recommends that you schedule a follow-up appointment in: 8 WEEKS WITH DR Jens Som  Your physician has requested that you have en exercise stress myoview. For further information please visit [email or web address] Please follow instruction sheet, as given.   Your physician has requested that you have an echocardiogram. Echocardiography is a painless test that uses sound waves to create images of your heart. It provides your doctor with information about the size and shape of your heart and how well your heart's chambers and valves are working. This procedure takes approximately one hour. There are no restrictions for this procedure.   START HYDRALAZINE 25 MG ONE TABLET THREE TIMES DAILY

## 2014-11-30 NOTE — Assessment & Plan Note (Signed)
Renal function has been followed by nephrology.

## 2014-11-30 NOTE — Assessment & Plan Note (Signed)
Plan echocardiogram to assess LV function and rule out wall motion abnormality.

## 2014-11-30 NOTE — Assessment & Plan Note (Signed)
Continue statin. We will obtain his most recent lipids and liver from primary care.

## 2014-12-14 ENCOUNTER — Ambulatory Visit (HOSPITAL_COMMUNITY): Payer: BLUE CROSS/BLUE SHIELD | Attending: Cardiovascular Disease

## 2014-12-14 ENCOUNTER — Other Ambulatory Visit: Payer: Self-pay

## 2014-12-14 DIAGNOSIS — E119 Type 2 diabetes mellitus without complications: Secondary | ICD-10-CM | POA: Insufficient documentation

## 2014-12-14 DIAGNOSIS — I340 Nonrheumatic mitral (valve) insufficiency: Secondary | ICD-10-CM | POA: Diagnosis not present

## 2014-12-14 DIAGNOSIS — I517 Cardiomegaly: Secondary | ICD-10-CM | POA: Diagnosis not present

## 2014-12-14 DIAGNOSIS — I10 Essential (primary) hypertension: Secondary | ICD-10-CM | POA: Diagnosis not present

## 2014-12-14 DIAGNOSIS — E785 Hyperlipidemia, unspecified: Secondary | ICD-10-CM | POA: Insufficient documentation

## 2014-12-14 DIAGNOSIS — R079 Chest pain, unspecified: Secondary | ICD-10-CM | POA: Diagnosis present

## 2014-12-14 DIAGNOSIS — F17200 Nicotine dependence, unspecified, uncomplicated: Secondary | ICD-10-CM | POA: Diagnosis not present

## 2014-12-14 MED ORDER — PERFLUTREN LIPID MICROSPHERE
1.0000 mL | Freq: Once | INTRAVENOUS | Status: AC
  Administered 2014-12-14: 1 mL via INTRAVENOUS

## 2014-12-22 ENCOUNTER — Telehealth: Payer: Self-pay | Admitting: *Deleted

## 2014-12-22 NOTE — Telephone Encounter (Signed)
Left message for patient to call ---need to give patient new time for his stress test and also make him aware of different instructions.

## 2014-12-28 LAB — HM DIABETES EYE EXAM

## 2015-01-02 ENCOUNTER — Telehealth (HOSPITAL_COMMUNITY): Payer: Self-pay

## 2015-01-02 NOTE — Telephone Encounter (Signed)
Encounter complete. 

## 2015-01-03 ENCOUNTER — Telehealth (HOSPITAL_COMMUNITY): Payer: Self-pay

## 2015-01-03 NOTE — Telephone Encounter (Signed)
Encounter complete. 

## 2015-01-04 ENCOUNTER — Inpatient Hospital Stay (HOSPITAL_COMMUNITY): Admission: RE | Admit: 2015-01-04 | Payer: BLUE CROSS/BLUE SHIELD | Source: Ambulatory Visit

## 2015-01-17 ENCOUNTER — Encounter: Payer: Self-pay | Admitting: Family Medicine

## 2015-01-30 NOTE — Progress Notes (Signed)
HPI: FU CAD. Cardiac catheterization was performed on June 12, 2009 following NSTEMI and revealed severe CAD. Patient underwent coronary artery bypass and graft on Jul 25, 2009. He has a lima to the LAD, saphenous vein graft to the diagonal, sequential saphenous vein graft to the second and third marginals and a saphenous vein graft to the acute marginal. Echocardiogram October 2016 was technically difficult. LV function normal. Grade 2 diastolic dysfunction. Severe left atrial enlargement and trace mitral regurgitation. Nuclear study scheduled for October but patient canceled. Since I last saw him  Current Outpatient Prescriptions  Medication Sig Dispense Refill  . amLODipine (NORVASC) 10 MG tablet TAKE 1 TABLET (10 MG TOTAL) BY MOUTH DAILY. 90 tablet 3  . aspirin 325 MG tablet Take 325 mg by mouth daily.      . clopidogrel (PLAVIX) 75 MG tablet Take 1 tablet (75 mg total) by mouth daily. (Patient not taking: Reported on 11/30/2014) 30 tablet 0  . colchicine 0.6 MG tablet Take 0.6 mg by mouth as needed.    . diazepam (VALIUM) 10 MG tablet Take 10 mg by mouth every 6 (six) hours as needed. Per Dr Nolen Mu, psychiatrist    . Eszopiclone (ESZOPICLONE) 3 MG TABS Take 1 tablet (3 mg total) by mouth at bedtime as needed. Take immediately before bedtime (Patient not taking: Reported on 11/30/2014) 30 tablet 5  . hydrALAZINE (APRESOLINE) 25 MG tablet Take 1 tablet (25 mg total) by mouth 3 (three) times daily. 270 tablet 3  . lamoTRIgine (LAMICTAL) 200 MG tablet Take 200 mg by mouth daily.     Marland Kitchen levothyroxine (LEVOTHROID) 25 MCG tablet Take 1 tablet (25 mcg total) by mouth daily before breakfast. (Patient not taking: Reported on 11/30/2014) 90 tablet 1  . metoprolol succinate (TOPROL-XL) 50 MG 24 hr tablet Take 1 tablet (50 mg total) by mouth daily. Take with or immediately following a meal. 30 tablet 0  . oxyCODONE-acetaminophen (PERCOCET/ROXICET) 5-325 MG per tablet Take 1-2 tablets by mouth every 8  (eight) hours as needed.     . pantoprazole (PROTONIX) 40 MG tablet Take 1 tablet (40 mg total) by mouth daily. (Patient not taking: Reported on 11/30/2014) 30 tablet 6  . predniSONE (DELTASONE) 20 MG tablet Take 1 tablet (20 mg total) by mouth 2 (two) times daily with a meal. (Patient not taking: Reported on 11/30/2014) 10 tablet 0  . rosuvastatin (CRESTOR) 40 MG tablet Take 1 tablet (40 mg total) by mouth at bedtime. 90 tablet 3  . Vitamin D, Ergocalciferol, (DRISDOL) 50000 UNITS CAPS Take 50,000 Units by mouth every 7 (seven) days. Per kidney doctor     No current facility-administered medications for this visit.     Past Medical History  Diagnosis Date  . Hyperlipidemia   . CAD (coronary artery disease)   . Hypertension   . Homocystinemia   . Obesity   . Diabetes mellitus     type II  . Sleep apnea   . Benign prostatic hypertrophy   . Bipolar affective disorder, mixed, moderate   . Renal insufficiency   . Nephrolithiasis   . Gout   . 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 Ankle arthroscopy    . Cosmetic surgery      chin  . Liposuction    . Nasal septum surgery    . Ankle surgery      Left  . Coronary artery bypass graft  07/25/2009    Social History   Social History  . Marital Status: Married    Spouse Name: N/A  . Number of Children: N/A  . Years of Education: N/A   Occupational History  . Not on file.   Social History Main Topics  . Smoking status: Never Smoker   . Smokeless tobacco: Not on file  . Alcohol Use: Not on file  . Drug Use: Not on file  . Sexual Activity: Not on file   Other Topics Concern  . Not on file   Social History Narrative    ROS: no fevers or chills, productive cough, hemoptysis, dysphasia, odynophagia, melena, hematochezia, dysuria, hematuria, rash, seizure activity, orthopnea, PND, pedal edema, claudication. Remaining systems are negative.  Physical  Exam: Well-developed well-nourished in no acute distress.  Skin is warm and dry.  HEENT is normal.  Neck is supple.  Chest is clear to auscultation with normal expansion.  Cardiovascular exam is regular rate and rhythm.  Abdominal exam nontender or distended. No masses palpated. Extremities show no edema. neuro grossly intact  ECG     This encounter was created in error - please disregard.

## 2015-02-08 ENCOUNTER — Encounter: Payer: BLUE CROSS/BLUE SHIELD | Admitting: Cardiology

## 2015-06-10 ENCOUNTER — Other Ambulatory Visit: Payer: Self-pay | Admitting: Family Medicine

## 2015-09-06 ENCOUNTER — Telehealth: Payer: Self-pay | Admitting: Family Medicine

## 2015-09-06 NOTE — Telephone Encounter (Signed)
Pt is having cpx labs tomorrow and was suppose to have a blood test to check ?heart. Pt would like to have test added to cpx labs

## 2015-09-07 ENCOUNTER — Other Ambulatory Visit (INDEPENDENT_AMBULATORY_CARE_PROVIDER_SITE_OTHER): Payer: BLUE CROSS/BLUE SHIELD

## 2015-09-07 ENCOUNTER — Telehealth: Payer: Self-pay | Admitting: *Deleted

## 2015-09-07 DIAGNOSIS — E119 Type 2 diabetes mellitus without complications: Secondary | ICD-10-CM | POA: Diagnosis not present

## 2015-09-07 DIAGNOSIS — Z0000 Encounter for general adult medical examination without abnormal findings: Secondary | ICD-10-CM | POA: Diagnosis not present

## 2015-09-07 LAB — CBC WITH DIFFERENTIAL/PLATELET
BASOS PCT: 0.1 % (ref 0.0–3.0)
Basophils Absolute: 0 10*3/uL (ref 0.0–0.1)
Eosinophils Absolute: 0.1 10*3/uL (ref 0.0–0.7)
Eosinophils Relative: 0.9 % (ref 0.0–5.0)
HEMATOCRIT: 43.6 % (ref 39.0–52.0)
Hemoglobin: 14.4 g/dL (ref 13.0–17.0)
LYMPHS ABS: 2.5 10*3/uL (ref 0.7–4.0)
LYMPHS PCT: 20.1 % (ref 12.0–46.0)
MCHC: 33 g/dL (ref 30.0–36.0)
MCV: 80.9 fl (ref 78.0–100.0)
MONOS PCT: 9.5 % (ref 3.0–12.0)
Monocytes Absolute: 1.2 10*3/uL — ABNORMAL HIGH (ref 0.1–1.0)
NEUTROS ABS: 8.5 10*3/uL — AB (ref 1.4–7.7)
Neutrophils Relative %: 69.4 % (ref 43.0–77.0)
PLATELETS: 278 10*3/uL (ref 150.0–400.0)
RBC: 5.39 Mil/uL (ref 4.22–5.81)
RDW: 15 % (ref 11.5–15.5)
WBC: 12.3 10*3/uL — ABNORMAL HIGH (ref 4.0–10.5)

## 2015-09-07 LAB — BASIC METABOLIC PANEL
BUN: 54 mg/dL — AB (ref 6–23)
CALCIUM: 9.3 mg/dL (ref 8.4–10.5)
CHLORIDE: 105 meq/L (ref 96–112)
CO2: 22 meq/L (ref 19–32)
CREATININE: 2.77 mg/dL — AB (ref 0.40–1.50)
GFR: 24.77 mL/min — AB (ref >60.00)
GLUCOSE: 297 mg/dL — AB (ref 70–99)
POTASSIUM: 4.8 meq/L (ref 3.5–5.1)
SODIUM: 138 meq/L (ref 135–145)

## 2015-09-07 LAB — HEPATIC FUNCTION PANEL
ALBUMIN: 4.4 g/dL (ref 3.5–5.2)
ALK PHOS: 145 U/L — AB (ref 39–117)
ALT: 19 U/L (ref 0–53)
AST: 12 U/L (ref 0–37)
Bilirubin, Direct: 0.1 mg/dL (ref 0.0–0.3)
TOTAL PROTEIN: 6.9 g/dL (ref 6.0–8.3)
Total Bilirubin: 0.5 mg/dL (ref 0.2–1.2)

## 2015-09-07 LAB — LIPID PANEL
CHOLESTEROL: 253 mg/dL — AB (ref 0–200)
HDL: 43 mg/dL (ref >39.00)
LDL Cholesterol: 171 mg/dL — ABNORMAL HIGH (ref 0–99)
NONHDL: 209.66
TRIGLYCERIDES: 195 mg/dL — AB (ref 0.0–149.0)
Total CHOL/HDL Ratio: 6
VLDL: 39 mg/dL (ref 0.0–40.0)

## 2015-09-07 LAB — PSA: PSA: 2.39 ng/mL (ref 0.10–4.00)

## 2015-09-07 LAB — HEMOGLOBIN A1C: Hgb A1c MFr Bld: 9.4 % — ABNORMAL HIGH (ref 4.6–6.5)

## 2015-09-07 LAB — TSH: TSH: 9.15 u[IU]/mL — ABNORMAL HIGH (ref 0.35–4.50)

## 2015-09-07 MED ORDER — PREDNISONE 20 MG PO TABS: 20.0000 mg | ORAL_TABLET | Freq: Two times a day (BID) | ORAL | Status: DC

## 2015-09-07 NOTE — Telephone Encounter (Signed)
Do you know anything about this? 

## 2015-09-07 NOTE — Telephone Encounter (Signed)
Patient walked in today stating "Gout in left hand is acting up." Dr. Caryl NeverBurchette aware of concern and requested to order prednisone 20 mg 1 PO BID x5days. -- Provider wanted me to educate patient around monitoring blood sugars. Patient is aware.

## 2015-09-07 NOTE — Telephone Encounter (Signed)
I am not sure what specifically he is referring to.  There are many blood tests related to the heart.  Discuss at CPE.

## 2015-09-10 NOTE — Telephone Encounter (Signed)
I called the pt and informed him of the message below and he agreed. 

## 2015-09-26 ENCOUNTER — Ambulatory Visit (INDEPENDENT_AMBULATORY_CARE_PROVIDER_SITE_OTHER): Payer: BLUE CROSS/BLUE SHIELD | Admitting: Family Medicine

## 2015-09-26 ENCOUNTER — Encounter: Payer: Self-pay | Admitting: Family Medicine

## 2015-09-26 VITALS — BP 162/100 | HR 77 | Temp 97.8°F | Ht 70.25 in

## 2015-09-26 DIAGNOSIS — K219 Gastro-esophageal reflux disease without esophagitis: Secondary | ICD-10-CM

## 2015-09-26 DIAGNOSIS — F3162 Bipolar disorder, current episode mixed, moderate: Secondary | ICD-10-CM

## 2015-09-26 DIAGNOSIS — N184 Chronic kidney disease, stage 4 (severe): Secondary | ICD-10-CM

## 2015-09-26 DIAGNOSIS — E1165 Type 2 diabetes mellitus with hyperglycemia: Secondary | ICD-10-CM | POA: Diagnosis not present

## 2015-09-26 DIAGNOSIS — I2510 Atherosclerotic heart disease of native coronary artery without angina pectoris: Secondary | ICD-10-CM | POA: Diagnosis not present

## 2015-09-26 DIAGNOSIS — I10 Essential (primary) hypertension: Secondary | ICD-10-CM

## 2015-09-26 DIAGNOSIS — R079 Chest pain, unspecified: Secondary | ICD-10-CM

## 2015-09-26 DIAGNOSIS — E038 Other specified hypothyroidism: Secondary | ICD-10-CM

## 2015-09-26 DIAGNOSIS — M109 Gout, unspecified: Secondary | ICD-10-CM

## 2015-09-26 HISTORY — PX: BASAL CELL CARCINOMA EXCISION: SHX1214

## 2015-09-26 MED ORDER — HYDRALAZINE HCL 25 MG PO TABS: 25.0000 mg | ORAL_TABLET | Freq: Three times a day (TID) | ORAL | Status: DC

## 2015-09-26 MED ORDER — ROSUVASTATIN CALCIUM 40 MG PO TABS: 40.0000 mg | ORAL_TABLET | Freq: Every day | ORAL | Status: DC

## 2015-09-26 MED ORDER — CLOPIDOGREL BISULFATE 75 MG PO TABS: 75.0000 mg | ORAL_TABLET | Freq: Every day | ORAL | Status: DC

## 2015-09-26 MED ORDER — COLCHICINE 0.6 MG PO TABS: 0.6000 mg | ORAL_TABLET | Freq: Every day | ORAL | Status: DC

## 2015-09-26 MED ORDER — PANTOPRAZOLE SODIUM 40 MG PO TBEC: 40.0000 mg | DELAYED_RELEASE_TABLET | Freq: Every day | ORAL | Status: AC

## 2015-09-26 MED ORDER — FEBUXOSTAT 40 MG PO TABS: 40.0000 mg | ORAL_TABLET | Freq: Every day | ORAL | Status: DC

## 2015-09-26 MED ORDER — LEVOTHYROXINE SODIUM 25 MCG PO TABS: 25.0000 ug | ORAL_TABLET | Freq: Every day | ORAL | Status: DC

## 2015-09-26 NOTE — Progress Notes (Signed)
Subjective:     Patient ID: [name], male   DOB: [birth year], 65 y.o.   MRN: [id number]  HPI Patient initially was scheduled for physical today. However, he has multiple medical issues he would rather discuss. He did have recent lab work couple weeks ago. He has very poor compliance with follow-up Multiple chronic problems including hypertension, CAD, GERD, type 2 diabetes, hypothyroidism, chronic kidney disease, gout, dyslipidemia, chronic leukocytosis, bipolar disorder, chronic insomnia, history of CVA  He has not had recent follow-up with specialists including psychiatry and at this point is basically only taking amlodipine, Lamictal, and metoprolol. He is supposed to be on multiple other medications.  He should be getting regular follow up with cardiology and nephrology.   Type 2 diabetes. Not taking any medications. He has refused insulin in the past. Has tried to manage this with diet but has very poor compliance at times.  Gout. Increased flareups over recent months. Multiple joints are involved. Chronic kidney disease stage IV- so not a good candidate for Allopurinol.. Has taken intermittent prednisone for severe flareups  Recent increased reflux symptoms. Has been on Protonix in the past. Frequently clears his throat. Frequently experiences hoarseness.  History of CAD. Needs to back into see cardiology. Occasional chest pain with extreme exertion.  No chest pain at rest.  Hypertension which is poorly controlled today. He is taking amlodipine and metoprolol but ran out of hydralazine.  Symptoms of frequent itching right ear canal greater than left No drainage.  Sometimes notices flaking of skin.  Past Medical History  Diagnosis Date  . Hyperlipidemia   . CAD (coronary artery disease)   . Hypertension   . Homocystinemia ([name])   . Obesity   . Diabetes mellitus     type II  . Sleep apnea   . Benign prostatic hypertrophy   . Bipolar affective disorder, mixed, moderate  ([name])   . Renal insufficiency   . Nephrolithiasis   . Gout   . 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 Ankle arthroscopy    . Cosmetic surgery      chin  . Liposuction    . Nasal septum surgery    . Ankle surgery      Left  . Coronary artery bypass graft  07/25/2009  . Basal cell carcinoma excision  09/26/15    reports that he has never smoked. He does not have any smokeless tobacco history on file. His alcohol and drug histories are not on file. family history includes Cancer in his mother; Depression in his mother; Diabetes in his brother and father; Heart attack in his father. Allergies  Allergen Reactions  . Nsaids      Review of Systems  Constitutional: Positive for fatigue. Negative for fever, chills, appetite change and unexpected weight change.  HENT: Positive for congestion. Negative for trouble swallowing.   Eyes: Negative for visual disturbance.  Respiratory: Negative for cough and chest tightness.   Cardiovascular: Positive for chest pain (see HPI). Negative for palpitations and leg swelling.  Gastrointestinal: Negative for nausea, vomiting, abdominal pain and diarrhea.  Endocrine: Negative for polydipsia and polyuria.  Genitourinary: Negative for dysuria.  Musculoskeletal: Positive for back pain and arthralgias.  Skin: Negative for rash.  Neurological: Negative for dizziness, syncope, weakness, light-headedness and headaches.  Psychiatric/Behavioral: Positive for sleep disturbance and dysphoric mood.       Objective:   Physical Exam  Constitutional: He is oriented to person, place,  and time. He appears well-developed and well-nourished.  HENT:  Left Ear: External ear normal.  Eczema right ear canal  Neck: Neck supple. No thyromegaly present.  Cardiovascular: Normal rate and regular rhythm.   Pulmonary/Chest: Effort normal and breath sounds normal. No respiratory distress. He has no wheezes. He has no  rales.  Abdominal: Soft. Bowel sounds are normal. He exhibits no distension. There is no tenderness. There is no rebound and no guarding.  Musculoskeletal: He exhibits no edema.  Lymphadenopathy:    He has no cervical adenopathy.  Neurological: He is alert and oriented to person, place, and time. No cranial nerve deficit.       Assessment:     #1 history of very poor compliance with follow-up and medication treatment for multiple medical problems  #2 history of CAD  #3 gout with more frequent flareups recently  #4 take 2 diabetes poorly controlled  #5 bipolar disorder.  Poor follow up with psychiatry.  #6 eczema right canal greater than left  #7 GERD poorly controlled off medication  #8 hypertension suboptimally controlled  #9 chronic kidney disease which is stable compared with 2 years ago  #10 hyperlipidemia poorly controlled off Crestor    Plan:     -Long discussion with patient regarding importance of compliance with medications -Refilled multiple chronic medications. - Hydralazine 25 mg 3 times a day-get back on. -We recommend he consider long-acting insulin but he declines at this time -We recommended getting reestablished with psychiatry and cardiology -He has health maintenance issues which he wishes to defer at this time. -offered Elocon lotion for ear canal eczema and he declines at this time. -initiate Uloric for gout prevention and check uric acid level at f/u in 3 months -start Colchicine 0.6 mg once daily for prophylaxis while initiating Uloric  Over 40 minutes spent with patient in direct care assessing multiple problems, reviewing recent labs, discussing goals of care, reviewing medications, and refilling multiple medications.  Devin CoveyBruce W Burchette MD [facility]   -

## 2015-09-26 NOTE — Patient Instructions (Signed)
Get back on your regular medications We need to get follow up in 3 months and will repeat labs then. Get back in to see Dr Nolen MuMcKinney.

## 2015-09-26 NOTE — Progress Notes (Signed)
Pre visit review using our clinic review tool, if applicable. No additional management support is needed unless otherwise documented below in the visit note. 

## 2015-10-18 ENCOUNTER — Telehealth: Payer: Self-pay

## 2015-10-18 NOTE — Telephone Encounter (Signed)
Prior Authorization entered for febuxostat (ULORIC) 40 MG tablet  Key: [id number]

## 2015-11-30 ENCOUNTER — Other Ambulatory Visit: Payer: Self-pay | Admitting: Cardiology

## 2015-11-30 DIAGNOSIS — R079 Chest pain, unspecified: Secondary | ICD-10-CM

## 2015-12-03 NOTE — Telephone Encounter (Signed)
Rx request sent to pharmacy.  

## 2016-01-01 ENCOUNTER — Ambulatory Visit: Payer: BLUE CROSS/BLUE SHIELD | Admitting: Family Medicine

## 2016-01-06 ENCOUNTER — Other Ambulatory Visit: Payer: Self-pay | Admitting: Family Medicine

## 2016-01-22 ENCOUNTER — Ambulatory Visit: Payer: BLUE CROSS/BLUE SHIELD | Admitting: Family Medicine

## 2016-03-06 ENCOUNTER — Other Ambulatory Visit: Payer: Self-pay | Admitting: Cardiology

## 2016-03-06 DIAGNOSIS — R079 Chest pain, unspecified: Secondary | ICD-10-CM

## 2016-03-06 NOTE — Telephone Encounter (Signed)
REFILL 

## 2016-03-18 ENCOUNTER — Telehealth: Payer: Self-pay | Admitting: Cardiology

## 2016-03-18 NOTE — Telephone Encounter (Signed)
Left msg for patient to call. 

## 2016-03-18 NOTE — Telephone Encounter (Signed)
Dr. Jens Somrenshaw pt Last seen 2016  Spoke to [name]. 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 Notes his symptoms are not every day. He actually describes a pounding in his chest which typically occurs late in the day (after 6pm). Happens when he is up and ambulatory or exerting himself & goes away after ~5 mins when he sits.  Denies "chest pain", describes as heavy beat. Denies shortness of breath, jaw, neck pain, increased fatigue.  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  Informed me he was in town this week and wondered if he could be seen - Thursday ideal for him. I scheduled w Franky MachoLuke at 9:30am on Thursday. Pt voiced thanks for arrangement, aware of office location/appt info.

## 2016-03-18 NOTE — Telephone Encounter (Signed)
Follow up ° ° ° °Pt verbalized that he is returning call for rn °

## 2016-03-18 NOTE — Telephone Encounter (Signed)
New Message  Pt voiced he's having coronary spasm and would like for nurse to give him a call.  Please f/u with pt ?

## 2016-03-20 ENCOUNTER — Encounter: Payer: Self-pay | Admitting: Cardiology

## 2016-03-20 ENCOUNTER — Ambulatory Visit (INDEPENDENT_AMBULATORY_CARE_PROVIDER_SITE_OTHER): Payer: BLUE CROSS/BLUE SHIELD | Admitting: Cardiology

## 2016-03-20 VITALS — BP 154/104 | HR 72 | Ht 70.25 in | Wt 258.4 lb

## 2016-03-20 DIAGNOSIS — I25118 Atherosclerotic heart disease of native coronary artery with other forms of angina pectoris: Secondary | ICD-10-CM

## 2016-03-20 DIAGNOSIS — I119 Hypertensive heart disease without heart failure: Secondary | ICD-10-CM

## 2016-03-20 DIAGNOSIS — R079 Chest pain, unspecified: Secondary | ICD-10-CM | POA: Diagnosis not present

## 2016-03-20 DIAGNOSIS — F3162 Bipolar disorder, current episode mixed, moderate: Secondary | ICD-10-CM

## 2016-03-20 DIAGNOSIS — Z951 Presence of aortocoronary bypass graft: Secondary | ICD-10-CM

## 2016-03-20 DIAGNOSIS — Z8673 Personal history of transient ischemic attack (TIA), and cerebral infarction without residual deficits: Secondary | ICD-10-CM

## 2016-03-20 DIAGNOSIS — N184 Chronic kidney disease, stage 4 (severe): Secondary | ICD-10-CM

## 2016-03-20 DIAGNOSIS — E1165 Type 2 diabetes mellitus with hyperglycemia: Secondary | ICD-10-CM

## 2016-03-20 DIAGNOSIS — M1039 Gout due to renal impairment, multiple sites: Secondary | ICD-10-CM

## 2016-03-20 MED ORDER — NITROGLYCERIN 0.4 MG SL SUBL: 0.4000 mg | SUBLINGUAL_TABLET | SUBLINGUAL | 2 refills | Status: DC | PRN

## 2016-03-20 MED ORDER — ROSUVASTATIN CALCIUM 10 MG PO TABS: 10.0000 mg | ORAL_TABLET | Freq: Every day | ORAL | 5 refills | Status: DC

## 2016-03-20 MED ORDER — ISOSORBIDE MONONITRATE ER 30 MG PO TB24: 30.0000 mg | ORAL_TABLET | Freq: Every day | ORAL | 5 refills | Status: DC

## 2016-03-20 MED ORDER — HYDRALAZINE HCL 25 MG PO TABS: 25.0000 mg | ORAL_TABLET | Freq: Three times a day (TID) | ORAL | 5 refills | Status: AC

## 2016-03-20 NOTE — Assessment & Plan Note (Signed)
Pt has significant exertional angina.Add nitrate, check Myoview for high risk ischemia

## 2016-03-20 NOTE — Assessment & Plan Note (Signed)
He has not medication for this for years but says he is willing to start

## 2016-03-20 NOTE — Assessment & Plan Note (Signed)
Last SCR 2.77

## 2016-03-20 NOTE — Assessment & Plan Note (Signed)
Followed by Dr Nolen MuMcKinney- he had some problems this fall but says he is doing better now.

## 2016-03-20 NOTE — Assessment & Plan Note (Signed)
Lima-LAD, SVG-Dx, SVG-OM2-OM3, SVG-AM

## 2016-03-20 NOTE — Assessment & Plan Note (Signed)
Wallenberg syndrome (lateral medullary infarct) 2009

## 2016-03-20 NOTE — Progress Notes (Signed)
03/20/2016 [name] [facility]   1950/05/01  [id number]  Primary Physician Kristian Covey, MD Primary Cardiologist: Dr Jens Som  HPI:  66 y/o male followed by Dr Jens Som with multiple medical problems including HTN with HCVD, HLD- uncontrolled, CRI-4, untreated DM, Bipolar disorder, and CAD. He had a NSTEMI 06/12/09. Cath revealed severe CAD. Echocardiogram showed normal LV function. Patient underwent coronary artery bypass and graft on Jul 25, 2009. He has a liima to the LAD, saphenous vein graft to the diagonal, sequential saphenous vein graft to the second and third marginals and a saphenous vein graft to the acute marginal. His last echo was in 2016 and he had an EF of 55% with moderate LVH, severe LAE, and grade 2 DD. His LOV with Dr Jens Som was Sept 2016 and the pt was having some chest pain then. A Myoview was ordered but the pt cancelled. He is in the office today after he called asking to be seen for HTN, and chest pain.   He describes exertional Lt sided chest pain, relieved with rest, for the past several months. He is out of some of his medications. He is not taking Hydralazine or Crestor. 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 He tells me this fall he had some type of mental breakdown. He has gained 23 lbs. He has not seen his psychiatrist Dr Nolen Mu lately.  He is willing to be evaluated now, "I have something I want to do next fall and I want to get this all done now".      Current Outpatient Prescriptions  Medication Sig Dispense Refill  . amLODipine (NORVASC) 10 MG tablet TAKE 1 TABLET (10 MG TOTAL) BY MOUTH DAILY. PLEASE SCHEDULE APPOINTMENT FOR REFILLS. 90 tablet 0  . aspirin 325 MG tablet Take 325 mg by mouth daily. Reported on 09/26/2015    . clopidogrel (PLAVIX) 75 MG tablet Take 1 tablet (75 mg total) by mouth daily. 90 tablet 3  . colchicine 0.6 MG tablet Take 1 tablet (0.6 mg total) by mouth daily. Reported on 09/26/2015  90 tablet 3  . hydrALAZINE (APRESOLINE) 25 MG tablet Take 1 tablet (25 mg total) by mouth 3 (three) times daily. 90 tablet 5  . lamoTRIgine (LAMICTAL) 200 MG tablet Take 200 mg by mouth daily. Reported on 09/26/2015    . metoprolol succinate (TOPROL-XL) 50 MG 24 hr tablet TAKE 1 TABLET BY MOUTH DAILY 30 tablet 11  . pantoprazole (PROTONIX) 40 MG tablet Take 1 tablet (40 mg total) by mouth daily. 90 tablet 3  . isosorbide mononitrate (IMDUR) 30 MG 24 hr tablet Take 1 tablet (30 mg total) by mouth daily. 30 tablet 5  . nitroGLYCERIN (NITROSTAT) 0.4 MG SL tablet Place 1 tablet (0.4 mg total) under the tongue every 5 (five) minutes as needed for chest pain. 25 tablet 2  . rosuvastatin (CRESTOR) 10 MG tablet Take 1 tablet (10 mg total) by mouth daily. 30 tablet 5   No current facility-administered medications for this visit.     Allergies  Allergen Reactions  . Nsaids     Social History   Social History  . Marital status: Married    Spouse name: N/A  . Number of children: N/A  . Years of education: N/A   Occupational History  . Not on file.   Social History Main Topics  . Smoking status: Never Smoker  . Smokeless tobacco: Not on file  . Alcohol use Not on  file  . Drug use: Unknown  . Sexual activity: Not on file   Other Topics Concern  . Not on file   Social History Narrative  . No narrative on file     Review of Systems: General: negative for chills, fever, night sweats or weight changes.  Cardiovascular: negative for dyspnea on exertion, edema, orthopnea, palpitations, paroxysmal nocturnal dyspnea or shortness of breath Dermatological: negative for rash Respiratory: negative for cough or wheezing Urologic: negative for hematuria Abdominal: negative for nausea, vomiting, diarrhea, bright red blood per rectum, melena, or hematemesis Neurologic: negative for visual changes, syncope, or dizziness All other systems reviewed and are otherwise negative except as noted  above.    Blood pressure (!) 154/104, pulse 72, height 5' 10.25" (1.784 m), weight 258 lb 6.4 oz (117.2 kg).  General appearance: alert, cooperative, no distress and moderately obese Neck: no carotid bruit and no JVD Lungs: clear to auscultation bilaterally Heart: regular rate and rhythm Abdomen: obese, non tender Extremities: extremities normal, atraumatic, no cyanosis or edema Pulses: 2+ and symmetric Skin: Skin color, texture, turgor normal. No rashes or lesions Neurologic: Grossly normal  EKG NSR, NSST changes similar to prior EKGs  ASSESSMENT AND PLAN:   Coronary artery disease with exertional angina ([name]) Pt has significant exertional angina.Add nitrate, check Myoview for high risk ischemia  Hx of CABG 2011 Lima-LAD, SVG-Dx, SVG-OM2-OM3, SVG-AM  Hypertensive cardiovascular disease 2D 2016-EF 55% with moderate LVH, severe LAE, grade 2 DD B/P poorly controlled  CKD (chronic kidney disease) stage 4, GFR 15-29 ml/min ([name]) Last SCR 2.77  Gout He is on colchicine and has had recent problems with gout  History of CVA (cerebrovascular accident) Wallenberg syndrome (lateral medullary infarct) 2009  Poorly controlled type 2 diabetes mellitus He has not medication for this for years but says he is willing to start  Moderate mixed bipolar I disorder ([name]) Followed by Dr Nolen MuMcKinney- he had some problems this fall but says he is doing better now.   Obesity (BMI 30-39.9) BMI 36   PLAN  Pt was seen by Dr Jens Somrenshaw and myself. Plan is to try and get his B/P under control-will resume Hydralazine, treat angina-add Imdur, resume statin at a lower dose secondary to concomitant use of colchicine-add Crestor 10 mg, and obtain a Lexiscan Myoview to r/o high risk ischemia. If not for his poor renal function we would probably proceed with diagnostic angiogram. If his Myoview is high risk he'll discuss implications with Dr Jens Somrenshaw for cath. If his Myoview is low risk we can try and treat  him medically.  F/U Dr Jens Somrenshaw in two weeks.   Corine ShelterLuke Miamor Ayler PA-C 03/20/2016 10:46 AM

## 2016-03-20 NOTE — Assessment & Plan Note (Signed)
2D 2016-EF 55% with moderate LVH, severe LAE, grade 2 DD B/P poorly controlled

## 2016-03-20 NOTE — Assessment & Plan Note (Signed)
BMI 36 

## 2016-03-20 NOTE — Assessment & Plan Note (Signed)
He is on colchicine and has had recent problems with gout

## 2016-03-20 NOTE — Patient Instructions (Addendum)
Medication Instructions:  START CRESTOR (ROSUVASTATIN) 10 MG DAILY  START IMDUR (ISOSORBIDE) 30 MG DAILY  MAKE SURE YOU ARE TAKING THE HYDRALAZINE 25 MG THREE TIMES A DAY  Use your NTG under your tongue for recurrent chest pain. May take one tablet every 5 minutes. If you are still having discomfort after 3 tablets in 15 minutes, call 911.  Labwork: NONE  Testing/Procedures: Your physician has requested that you have a lexiscan myoview. For further information please visit [email or web address]. Please follow instruction sheet, as given. BY THE END OF NEXT WEEK   Follow-Up: Your physician recommends that you schedule a follow-up appointment in: 2 WEEKS WITH DR Jens SomRENSHAW   If you need a refill on your cardiac medications before your next appointment, please call your pharmacy.

## 2016-03-25 ENCOUNTER — Telehealth (HOSPITAL_COMMUNITY): Payer: Self-pay

## 2016-03-25 NOTE — Telephone Encounter (Signed)
Encounter complete. 

## 2016-03-27 ENCOUNTER — Inpatient Hospital Stay (HOSPITAL_COMMUNITY): Admission: RE | Admit: 2016-03-27 | Payer: BLUE CROSS/BLUE SHIELD | Source: Ambulatory Visit

## 2016-04-01 ENCOUNTER — Telehealth (HOSPITAL_COMMUNITY): Payer: Self-pay

## 2016-04-01 NOTE — Telephone Encounter (Signed)
Encounter complete. 

## 2016-04-03 ENCOUNTER — Encounter (HOSPITAL_COMMUNITY): Payer: Self-pay | Admitting: *Deleted

## 2016-04-03 ENCOUNTER — Ambulatory Visit (INDEPENDENT_AMBULATORY_CARE_PROVIDER_SITE_OTHER): Payer: BLUE CROSS/BLUE SHIELD | Admitting: Cardiology

## 2016-04-03 ENCOUNTER — Ambulatory Visit (HOSPITAL_COMMUNITY)
Admission: RE | Admit: 2016-04-03 | Discharge: 2016-04-03 | Disposition: A | Discharge status: Home / Self Care | Payer: BLUE CROSS/BLUE SHIELD | Source: Ambulatory Visit | Attending: Cardiovascular Disease | Admitting: Cardiovascular Disease

## 2016-04-03 ENCOUNTER — Encounter: Payer: Self-pay | Admitting: Cardiology

## 2016-04-03 VITALS — BP 124/78 | HR 50 | Ht 70.0 in | Wt 258.0 lb

## 2016-04-03 DIAGNOSIS — I10 Essential (primary) hypertension: Secondary | ICD-10-CM | POA: Diagnosis not present

## 2016-04-03 DIAGNOSIS — I252 Old myocardial infarction: Secondary | ICD-10-CM | POA: Diagnosis not present

## 2016-04-03 DIAGNOSIS — R9439 Abnormal result of other cardiovascular function study: Secondary | ICD-10-CM | POA: Insufficient documentation

## 2016-04-03 DIAGNOSIS — Z951 Presence of aortocoronary bypass graft: Secondary | ICD-10-CM | POA: Diagnosis present

## 2016-04-03 DIAGNOSIS — I2510 Atherosclerotic heart disease of native coronary artery without angina pectoris: Secondary | ICD-10-CM

## 2016-04-03 DIAGNOSIS — R079 Chest pain, unspecified: Secondary | ICD-10-CM | POA: Diagnosis not present

## 2016-04-03 DIAGNOSIS — R001 Bradycardia, unspecified: Secondary | ICD-10-CM

## 2016-04-03 LAB — MYOCARDIAL PERFUSION IMAGING
LV dias vol: 180 mL (ref 62–150)
LV sys vol: 90 mL
Peak HR: 71 {beats}/min
Rest HR: 65 {beats}/min
SDS: 9
SRS: 3
SSS: 12
TID: 1.28

## 2016-04-03 MED ORDER — AMINOPHYLLINE 25 MG/ML IV SOLN
75.0000 mg | Freq: Once | INTRAVENOUS | Status: AC
  Administered 2016-04-03: 75 mg via INTRAVENOUS

## 2016-04-03 MED ORDER — TECHNETIUM TC 99M TETROFOSMIN IV KIT
30.9000 | PACK | Freq: Once | INTRAVENOUS | Status: AC | PRN
  Administered 2016-04-03: 30.9 via INTRAVENOUS
  Filled 2016-04-03: qty 31

## 2016-04-03 MED ORDER — CLOPIDOGREL BISULFATE 75 MG PO TABS: 75.0000 mg | ORAL_TABLET | Freq: Every day | ORAL | 3 refills | Status: AC

## 2016-04-03 MED ORDER — TECHNETIUM TC 99M TETROFOSMIN IV KIT
11.0000 | PACK | Freq: Once | INTRAVENOUS | Status: AC | PRN
  Administered 2016-04-03: 11 via INTRAVENOUS
  Filled 2016-04-03: qty 11

## 2016-04-03 MED ORDER — REGADENOSON 0.4 MG/5ML IV SOLN
0.4000 mg | Freq: Once | INTRAVENOUS | Status: AC
  Administered 2016-04-03: 0.4 mg via INTRAVENOUS

## 2016-04-03 MED ORDER — ROSUVASTATIN CALCIUM 10 MG PO TABS: 10.0000 mg | ORAL_TABLET | Freq: Every day | ORAL | 3 refills | Status: DC

## 2016-04-03 NOTE — Patient Instructions (Signed)
Medication Instructions:   STOP METOPROLOL  Testing/Procedures:  Your physician has recommended that you wear a 24 HOUR holter monitor. Holter monitors are medical devices that record the heart's electrical activity. Doctors most often use these monitors to diagnose arrhythmias. Arrhythmias are problems with the speed or rhythm of the heartbeat. The monitor is a small, portable device. You can wear one while you do your normal daily activities. This is usually used to diagnose what is causing palpitations/syncope (passing out).    Follow-Up:  Your physician recommends that you schedule a follow-up appointment in: 8 WEEKS WITH DR Jens SomRENSHAW

## 2016-04-03 NOTE — Progress Notes (Signed)
EKG's prior to beginning Lexiscan infusion showed Mobitz type 1second degree AV block. Per Dr Jens Somrenshaw, ok to proceed with Lexican. Pt became bradycardic, hypotensive, and developed heart block. Dr Jens Somrenshaw called to monitor pt. Symptoms resolved. Pt was added to Dr Jens Somrenshaw schedule today to discuss Myoview results.

## 2016-04-03 NOTE — Progress Notes (Signed)
HPI: FU CAD; also with HTN, HLD- uncontrolled, CRI-4, untreated DM, Bipolar disorder. He had a NSTEMI 06/12/09. Cath revealed severe CAD. Echocardiogram showed normal LV function. Patient underwent coronary artery bypass and graft on Jul 25, 2009. He has a liima to the LAD, saphenous vein graft to the diagonal, sequential saphenous vein graft to the second and third marginals and a saphenous vein graft to the acute marginal. His last echo was in 2016 and he had an EF of 55% with moderate LVH, severe LAE, and grade 2 DD. Patient seen recently and noted to have chest pain. He was scheduled for a nuclear study. Prior to the study is electrocardiogram showed sinus rhythm with Mobitz 1 second-degree AV block. After Lexiscan injection he developed heart block, bradycardia and hypotension with systolic blood pressure in the 60s. He had some chest tightness and dizziness. His symptoms were short-lived and resolved. He was added to my schedule. Patient states that he has not had chest pain recently after initiating isosorbide. He denies dyspnea on exertion, orthopnea, PND, pedal edema or syncope.  Current Outpatient Prescriptions  Medication Sig Dispense Refill  . amLODipine (NORVASC) 10 MG tablet TAKE 1 TABLET (10 MG TOTAL) BY MOUTH DAILY. PLEASE SCHEDULE APPOINTMENT FOR REFILLS. 90 tablet 0  . aspirin 325 MG tablet Take 325 mg by mouth daily. Reported on 09/26/2015    . clopidogrel (PLAVIX) 75 MG tablet Take 1 tablet (75 mg total) by mouth daily. 90 tablet 3  . colchicine 0.6 MG tablet Take 1 tablet (0.6 mg total) by mouth daily. Reported on 09/26/2015 90 tablet 3  . hydrALAZINE (APRESOLINE) 25 MG tablet Take 1 tablet (25 mg total) by mouth 3 (three) times daily. 90 tablet 5  . isosorbide mononitrate (IMDUR) 30 MG 24 hr tablet Take 1 tablet (30 mg total) by mouth daily. 30 tablet 5  . lamoTRIgine (LAMICTAL) 200 MG tablet Take 200 mg by mouth daily. Reported on 09/26/2015    . metoprolol succinate  (TOPROL-XL) 50 MG 24 hr tablet TAKE 1 TABLET BY MOUTH DAILY 30 tablet 11  . nitroGLYCERIN (NITROSTAT) 0.4 MG SL tablet Place 1 tablet (0.4 mg total) under the tongue every 5 (five) minutes as needed for chest pain. 25 tablet 2  . rosuvastatin (CRESTOR) 10 MG tablet Take 1 tablet (10 mg total) by mouth daily. 30 tablet 5  . pantoprazole (PROTONIX) 40 MG tablet Take 1 tablet (40 mg total) by mouth daily. (Patient not taking: Reported on 04/03/2016) 90 tablet 3   No current facility-administered medications for this visit.      Past Medical History:  Diagnosis Date  . Benign prostatic hypertrophy   . Bipolar affective disorder, mixed, moderate ([name])   . CAD (coronary artery disease)   . Diabetes mellitus    type II  . Gout   . Homocystinemia ([name])   . Hyperlipidemia   . Hypertension   . Nephrolithiasis   . Obesity   . Renal insufficiency   . Sleep apnea   . 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 ANKLE ARTHROSCOPY    . ANKLE SURGERY     Left  . BASAL CELL CARCINOMA EXCISION  09/26/15  . CORONARY ARTERY BYPASS GRAFT  07/25/2009  . COSMETIC SURGERY     chin  . LIPOSUCTION    . NASAL SEPTUM SURGERY      Social History   Social History  . Marital status:  Married    Spouse name: N/A  . Number of children: N/A  . Years of education: N/A   Occupational History  . Not on file.   Social History Main Topics  . Smoking status: Never Smoker  . Smokeless tobacco: Never Used  . Alcohol use Not on file  . Drug use: Unknown  . Sexual activity: Not on file   Other Topics Concern  . Not on file   Social History Narrative  . No narrative on file    Family History  Problem Relation Age of Onset  . Depression Mother   . Cancer Mother     breast  . Diabetes Father   . Heart attack Father   . Diabetes Brother     ROS: no fevers or chills, productive cough, hemoptysis, dysphasia, odynophagia, melena, hematochezia,  dysuria, hematuria, rash, seizure activity, orthopnea, PND, pedal edema, claudication. Remaining systems are negative.  Physical Exam: Well-developed well-nourished in no acute distress.  Skin is warm and dry.  HEENT is normal.  Neck is supple. No bruits Chest is clear to auscultation with normal expansion.  Cardiovascular exam is regular rate and rhythm.  Abdominal exam nontender or distended. No masses palpated. Extremities show no edema. neuro grossly intact  ECG-baseline electrocardiogram prior to stress test showed sinus rhythm with Mobitz 1, right bundle branch block and no ST changes.  Note patient developed complete heart block with Lexiscan which resolved.  A/P  1 hyperlipidemia-continue statin.  2 hypertension-blood pressure controlled. Continue present medications.  3 bradycardia-patient noted to have Mobitz 1 second-degree AV block. Discontinue Toprol. In one week check 24-hour Holter monitor.  4 coronary artery disease-continue aspirin and statin.  5 recent chest pain-we will await final results of nuclear study. Preliminary review suggests prior inferior infarct and mild anterior ischemia and not high risk. Would be hesitant to proceed with catheterization unless high risk given significant renal insufficiency. Note he has had marked improvement in symptoms with the addition of nitrates.   6 stage IV chronic kidney disease-followed by primary care.  Olga Millers, MD

## 2016-04-07 ENCOUNTER — Telehealth: Payer: Self-pay | Admitting: Family Medicine

## 2016-04-07 MED ORDER — LAMOTRIGINE 200 MG PO TABS: 200.0000 mg | ORAL_TABLET | Freq: Every day | ORAL | 2 refills | Status: DC

## 2016-04-07 NOTE — Telephone Encounter (Signed)
We can refill for 3 months but he definitely needs to establish as soon as possible.  I think he was seeing Dr Andee PolesParish McKinney.  Could try Dr Jennelle Humanottle with [facility]- though I'm not sure if he is taking any new patients.

## 2016-04-07 NOTE — Telephone Encounter (Signed)
Pre visit review using our clinic review tool, if applicable. No additional management support is needed unless otherwise documented below in the visit note. 

## 2016-04-07 NOTE — Telephone Encounter (Signed)
Pt is return autumn call °

## 2016-04-07 NOTE — Telephone Encounter (Signed)
Pt is aware of annotations.  

## 2016-04-07 NOTE — Addendum Note (Signed)
Addended by: Tempie HoistMCNEIL, AUTUMN M on: 04/07/2016 02:52 PM   Modules accepted: Orders

## 2016-04-07 NOTE — Telephone Encounter (Signed)
Pt was last seen on 09/26/2015. Please advise if this is something you can refill until he establishes with another Psychiatrist.

## 2016-04-07 NOTE — Telephone Encounter (Signed)
Pt called in and made a f/u appt for 04/11/16.  However, pt's needing an rx refill for his lamoTRIgine (LAMICTAL) 200 MG tablet, and the psychiatrist has quit practicing meds and is now doing research so he is needing "bandaid" script to get him through AND a recommendation for a new Psychiatrist to take him on as a new patient.  Stated that he is out of the lamoTRIgine (LAMICTAL) 200 MG tablet and is in dire need of this script.  [phone] is the best number to reach him at.

## 2016-04-07 NOTE — Telephone Encounter (Signed)
Medication sent in for patient. Left message for him to call back.

## 2016-04-08 ENCOUNTER — Telehealth: Payer: Self-pay | Admitting: *Deleted

## 2016-04-08 ENCOUNTER — Ambulatory Visit (INDEPENDENT_AMBULATORY_CARE_PROVIDER_SITE_OTHER): Payer: BLUE CROSS/BLUE SHIELD

## 2016-04-08 ENCOUNTER — Other Ambulatory Visit: Payer: Self-pay | Admitting: *Deleted

## 2016-04-08 DIAGNOSIS — R001 Bradycardia, unspecified: Secondary | ICD-10-CM | POA: Diagnosis not present

## 2016-04-08 MED ORDER — ISOSORBIDE MONONITRATE ER 60 MG PO TB24: 60.0000 mg | ORAL_TABLET | Freq: Every day | ORAL | 5 refills | Status: DC

## 2016-04-08 NOTE — Telephone Encounter (Signed)
Called pt to communicate test findings, recommendations. Pt informs me he's been holding Toprol since the test (had reaction to injection on test table w pronounced bradycardia and hypotension.)   Does he need to resume this medication?

## 2016-04-08 NOTE — Telephone Encounter (Signed)
Stay of toprol Olga MillersBrian Cung Masterson

## 2016-04-08 NOTE — Progress Notes (Unsigned)
New rx called pharmacy. Patient aware. See result note on myoview.

## 2016-04-08 NOTE — Telephone Encounter (Signed)
-----   Message from Abelino DerrickLuke K Kilroy, [place] sent at 04/04/2016  9:07 AM EST ----- Please let the [name] know his stress test did not show any high risk ischemia. He did have some transient irregular rhythm (2 nd degree AVB). I have reviewed his test results with Dr Jens Somrenshaw- increase Imdur to 60 mg daily, decrease Toprol to 25 mg daily. He needs an appointment with Dr Jens Somrenshaw in 3-4 weeks for f/u.  Corine ShelterLUKE KILROY PA-C 04/04/2016 9:07 AM

## 2016-04-08 NOTE — Telephone Encounter (Signed)
Recommendations relayed to patient, who voiced understanding. He stated he will call back to schedule his 3-4 week followup - cannot do now bc he is in meeting.

## 2016-04-11 ENCOUNTER — Ambulatory Visit (INDEPENDENT_AMBULATORY_CARE_PROVIDER_SITE_OTHER): Payer: BLUE CROSS/BLUE SHIELD | Admitting: Family Medicine

## 2016-04-11 ENCOUNTER — Encounter: Payer: Self-pay | Admitting: Family Medicine

## 2016-04-11 ENCOUNTER — Ambulatory Visit: Payer: BLUE CROSS/BLUE SHIELD | Admitting: Cardiology

## 2016-04-11 VITALS — BP 148/94 | HR 91 | Ht 70.0 in

## 2016-04-11 DIAGNOSIS — E1165 Type 2 diabetes mellitus with hyperglycemia: Secondary | ICD-10-CM | POA: Diagnosis not present

## 2016-04-11 DIAGNOSIS — H60502 Unspecified acute noninfective otitis externa, left ear: Secondary | ICD-10-CM

## 2016-04-11 DIAGNOSIS — M1039 Gout due to renal impairment, multiple sites: Secondary | ICD-10-CM

## 2016-04-11 DIAGNOSIS — I10 Essential (primary) hypertension: Secondary | ICD-10-CM | POA: Diagnosis not present

## 2016-04-11 DIAGNOSIS — E785 Hyperlipidemia, unspecified: Secondary | ICD-10-CM

## 2016-04-11 MED ORDER — ONETOUCH LANCETS MISC: 2 refills | Status: DC

## 2016-04-11 MED ORDER — GLUCOSE BLOOD VI STRP: ORAL_STRIP | 2 refills | Status: DC

## 2016-04-11 MED ORDER — CIPROFLOXACIN-HYDROCORTISONE 0.2-1 % OT SUSP: 3.0000 [drp] | Freq: Two times a day (BID) | OTIC | 1 refills | Status: DC

## 2016-04-11 MED ORDER — ONETOUCH VERIO FLEX SYSTEM W/DEVICE KIT: 1.0000 | PACK | Freq: Once | 0 refills | Status: AC

## 2016-04-11 NOTE — Patient Instructions (Addendum)
-  Use Cipro HC drops 3 drops left ear twice daily -Keep ear as dry as possible -Start Basiglar insulin 10 units once daily -Monitor blood sugar with fasting glucose most mornings for the next couple weeks and write down readings -Left plan follow-up in a couple weeks to reassess -Follow-up for any hypoglycemia symptoms such as sweats, nausea, weakness, tremors

## 2016-04-11 NOTE — Progress Notes (Signed)
Pre visit review using our clinic review tool, if applicable. No additional management support is needed unless otherwise documented below in the visit note. 

## 2016-04-11 NOTE — Progress Notes (Signed)
Subjective:     Patient ID: Devin FullerMichael A [name], male   DOB: 09/23/1950, 66 y.o.   MRN: [id number]  HPI Patient has multiple chronic medical problems and history of very poor compliance with medication and follow-up. He is seen today for the following issues  Left ear pain for the past couple weeks. He's noticed some fullness but no drainage. Denies any right ear symptoms. Does not do any swimming. No vertigo.  Has not used any drops in ear.  Does use Q-tips sometimes.  Patient has type 2 diabetes and has basically refused insulin in the past. He has chronic kidney disease so medications are limited. He comes in today stating that he is willing to reconsider insulin at this time. He does not have a home glucose monitor. Last hemoglobin A1c 9.4%. He has not had eye exam in over a year.  Dyslipidemia and history of CAD. He states he is back on Crestor. Recent nuclear stress test and after injection of Lexiscan had heart block. This was a low risk study. Recent increase Imdur to 60 mg daily and he thinks this has helped his chest symptoms.  Obstructive sleep apnea. Did not tolerate CPAP. Frequent daytime somnolence.  Past Medical History:  Diagnosis Date  . Benign prostatic hypertrophy   . Bipolar affective disorder, mixed, moderate ([name])   . CAD (coronary artery disease)   . Diabetes mellitus    type II  . Gout   . Homocystinemia ([name])   . Hyperlipidemia   . Hypertension   . Nephrolithiasis   . Obesity   . Renal insufficiency   . Sleep apnea   . 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 ANKLE ARTHROSCOPY    . ANKLE SURGERY     Left  . BASAL CELL CARCINOMA EXCISION  09/26/15  . CORONARY ARTERY BYPASS GRAFT  07/25/2009  . COSMETIC SURGERY     chin  . LIPOSUCTION    . NASAL SEPTUM SURGERY      reports that he has never smoked. He has never used smokeless tobacco. His alcohol and drug histories are not on file. family history  includes Cancer in his mother; Depression in his mother; Diabetes in his brother and father; Heart attack in his father. Allergies  Allergen Reactions  . Nsaids      Review of Systems  Constitutional: Positive for fatigue. Negative for chills, fever and unexpected weight change.  Eyes: Negative for visual disturbance.  Respiratory: Negative for cough, chest tightness and shortness of breath.   Cardiovascular: Negative for chest pain, palpitations and leg swelling.  Neurological: Negative for dizziness, syncope, weakness, light-headedness and headaches.       Objective:   Physical Exam  Constitutional: He is oriented to person, place, and time. He appears well-developed and well-nourished.  HENT:  Right Ear: External ear normal.  Mouth/Throat: Oropharynx is clear and moist.  Left ear canal no cerumen. He has some diffuse soft tissue edema and mild erythema. No exudate  Eyes: Pupils are equal, round, and reactive to light.  Neck: Neck supple. No thyromegaly present.  Cardiovascular: Normal rate and regular rhythm.   Pulmonary/Chest: Effort normal and breath sounds normal. No respiratory distress. He has no wheezes. He has no rales.  Musculoskeletal: He exhibits no edema.  Neurological: He is alert and oriented to person, place, and time.       Assessment:     #1 probable left otitis externa.  No evidence  to suggest "malignant otitis externa"  #2 history of type 2 diabetes with history of poor compliance with follow-up and poor compliance with diet and exercise  #3 dyslipidemia  #4 history of CAD with recent nuclear stress test above      Plan:     -Start Cipro HC otic 3 drops left ear twice a day -Home glucose monitor given -Set up diabetic eye exam -Start Basiglar insulin 10 units once daily-with sample pen given and nurse instructed in use. -Check labs today with hemoglobin A1c, lipid panel, basic metabolic panel, hepatic panel -We explained that we must have close  follow-up if we have any hopes to try to control his diabetes and other medical problems.  Kristian Covey MD [facility]

## 2016-04-11 NOTE — Progress Notes (Signed)
Verbal order received for patient to receive sample medication. Patient teaching completed by provider.   Medication (including dosage):  Lot #: [id number] FE Expiration: 07/2016  Qty: 1

## 2016-04-14 ENCOUNTER — Telehealth: Payer: Self-pay | Admitting: Cardiology

## 2016-04-14 NOTE — Telephone Encounter (Signed)
F/u Message ° °Pt returning RN call. Please call back to discuss  °

## 2016-04-14 NOTE — Telephone Encounter (Signed)
Spoke with pt, aware of holter results, Follow up scheduled with dr Jens Somcrenshaw.

## 2016-04-15 ENCOUNTER — Telehealth: Payer: Self-pay

## 2016-04-15 MED ORDER — BAYER CONTOUR NEXT MONITOR W/DEVICE KIT: PACK | 0 refills | Status: AC

## 2016-04-15 MED ORDER — GLUCOSE BLOOD VI STRP: ORAL_STRIP | 2 refills | Status: AC

## 2016-04-15 MED ORDER — BAYER MICROLET LANCETS MISC: 2 refills | Status: AC

## 2016-04-15 NOTE — Addendum Note (Signed)
Addended by: Tempie HoistMCNEIL, AUTUMN M on: 04/15/2016 03:35 PM   Modules accepted: Orders

## 2016-04-15 NOTE — Telephone Encounter (Signed)
I have sent in the alternate diabetic supply's.

## 2016-04-15 NOTE — Telephone Encounter (Signed)
Received PA request from [facility] for One Touch Verio test strips. Pa submitted & is pending. Key: [id number]

## 2016-04-15 NOTE — Telephone Encounter (Signed)
Denied.   [facility].

## 2016-04-23 ENCOUNTER — Telehealth: Payer: Self-pay | Admitting: Family Medicine

## 2016-04-23 NOTE — Telephone Encounter (Signed)
Pt would like new rx ?hydrocodone cough syrup pt had cough medication in past. Pt said the medicine is red

## 2016-04-24 NOTE — Telephone Encounter (Signed)
LM for pt to call back to discuss further or make an appt. Not a typical a medicine that he prescribes without and appt.

## 2016-04-24 NOTE — Telephone Encounter (Signed)
Tried calling pt back with NA. 

## 2016-05-08 NOTE — Progress Notes (Deleted)
HPI: FU CAD; also with HTN, HLD- uncontrolled, CRI-4, untreated DM, Bipolar disorder. He had a NSTEMI 06/12/09. Cath revealed severe CAD. Echocardiogram showed normal LV function. Patient underwent coronary artery bypass and graft on Jul 25, 2009. He has a liima to the LAD, saphenous vein graft to the diagonal, sequential saphenous vein graft to the second and third marginals and a saphenous vein graft to the acute marginal. His last echo was in 2016 and he had an EF of 55% with moderate LVH, severe LAE, and grade 2 DD. Patient had a nuclear study January 2018 for chest pain. He was noted to have Mobitz 1 prior to the study and developed complete heart block and hypotension with Lexiscan which resolved. The study showed an ejection fraction of 50%. There was prior myocardial infarction with mild peri-infarct ischemia. Toprol DCed. Pt treated medically. Follow-up Holter monitor January 2018 showed sinus rhythm with first-degree AV block, PACs, PVCs, rare couplet, Mobitz 1 and 2-1 AV block. No symptoms. Since last seen  Current Outpatient Prescriptions  Medication Sig Dispense Refill  . amLODipine (NORVASC) 10 MG tablet TAKE 1 TABLET (10 MG TOTAL) BY MOUTH DAILY. PLEASE SCHEDULE APPOINTMENT FOR REFILLS. 90 tablet 0  . aspirin 325 MG tablet Take 325 mg by mouth daily. Reported on 09/26/2015    . BAYER MICROLET LANCETS lancets Check blood sugars once per day. E11.65 100 each 2  . Blood Glucose Monitoring Suppl (BAYER CONTOUR NEXT MONITOR) w/Device KIT Use as directed. DX: E11.65 1 kit 0  . ciprofloxacin-hydrocortisone (CIPRO HC OTIC) otic suspension Place 3 drops into the left ear 2 (two) times daily. 10 mL 1  . clopidogrel (PLAVIX) 75 MG tablet Take 1 tablet (75 mg total) by mouth daily. 90 tablet 3  . colchicine 0.6 MG tablet Take 1 tablet (0.6 mg total) by mouth daily. Reported on 09/26/2015 90 tablet 3  . glucose blood (BAYER CONTOUR NEXT TEST) test strip Check blood sugar once per day. DX: E11.65  100 each 2  . hydrALAZINE (APRESOLINE) 25 MG tablet Take 1 tablet (25 mg total) by mouth 3 (three) times daily. 90 tablet 5  . isosorbide mononitrate (IMDUR) 60 MG 24 hr tablet Take 60 mg by mouth daily.    Marland Kitchen lamoTRIgine (LAMICTAL) 200 MG tablet Take 1 tablet (200 mg total) by mouth daily. Reported on 09/26/2015 30 tablet 2  . nitroGLYCERIN (NITROSTAT) 0.4 MG SL tablet Place 1 tablet (0.4 mg total) under the tongue every 5 (five) minutes as needed for chest pain. 25 tablet 2  . pantoprazole (PROTONIX) 40 MG tablet Take 1 tablet (40 mg total) by mouth daily. 90 tablet 3  . rosuvastatin (CRESTOR) 40 MG tablet Take 40 mg by mouth daily.     No current facility-administered medications for this visit.      Past Medical History:  Diagnosis Date  . Benign prostatic hypertrophy   . Bipolar affective disorder, mixed, moderate ([place])   . CAD (coronary artery disease)   . Diabetes mellitus    type II  . Gout   . Homocystinemia ([place])   . Hyperlipidemia   . Hypertension   . Nephrolithiasis   . Obesity   . Renal insufficiency   . Sleep apnea   . 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 ANKLE ARTHROSCOPY    . ANKLE SURGERY     Left  . BASAL CELL CARCINOMA EXCISION  09/26/15  .  CORONARY ARTERY BYPASS GRAFT  07/25/2009  . COSMETIC SURGERY     chin  . LIPOSUCTION    . NASAL SEPTUM SURGERY      Social History   Social History  . Marital status: Married    Spouse name: N/A  . Number of children: N/A  . Years of education: N/A   Occupational History  . Not on file.   Social History Main Topics  . Smoking status: Never Smoker  . Smokeless tobacco: Never Used  . Alcohol use Not on file  . Drug use: Unknown  . Sexual activity: Not on file   Other Topics Concern  . Not on file   Social History Narrative  . No narrative on file    Family History  Problem Relation Age of Onset  . Depression Mother   . Cancer Mother      breast  . Diabetes Father   . Heart attack Father   . Diabetes Brother     ROS: no fevers or chills, productive cough, hemoptysis, dysphasia, odynophagia, melena, hematochezia, dysuria, hematuria, rash, seizure activity, orthopnea, PND, pedal edema, claudication. Remaining systems are negative.  Physical Exam: Well-developed well-nourished in no acute distress.  Skin is warm and dry.  HEENT is normal.  Neck is supple.  Chest is clear to auscultation with normal expansion.  Cardiovascular exam is regular rate and rhythm.  Abdominal exam nontender or distended. No masses palpated. Extremities show no edema. neuro grossly intact  ECG- personally reviewed  A/P  1  Kirk Ruths, MD

## 2016-05-13 ENCOUNTER — Ambulatory Visit: Payer: BLUE CROSS/BLUE SHIELD | Admitting: Cardiology

## 2016-05-13 NOTE — Progress Notes (Deleted)
HPI: FU CAD; also with HTN, HLD- uncontrolled, CRI-4, untreated DM, Bipolar disorder. He had a NSTEMI 06/12/09. Cath revealed severe CAD. Patient underwent coronary artery bypass and graft on Jul 25, 2009. He has a LIMA to the LAD, saphenous vein graft to the diagonal, sequential saphenous vein graft to the second and third marginals and a saphenous vein graft to the acute marginal. His last echo was in 2016 and he had an EF of 55% with moderate LVH, severe LAE, and grade 2 DD. Patient seen recently and noted to have chest pain. He was scheduled for a nuclear study. Prior to the study is electrocardiogram showed sinus rhythm with Mobitz 1 second-degree AV block. After Lexiscan injection he developed heart block, bradycardia and hypotension with systolic blood pressure in the 60s. He had some chest tightness and dizziness. His symptoms were short-lived and resolved. Beta blocker DCed. Results of nuclear study showed ejection fraction 50% and small area of mid anterior wall ischemia treated medically. Holter monitor January 2018 showed sinus with first-degree AV block, PACs, PVCs, rare couplet, Mobitz 1 and 2-1 AV block. Since last seen,   Current Outpatient Prescriptions  Medication Sig Dispense Refill  . amLODipine (NORVASC) 10 MG tablet TAKE 1 TABLET (10 MG TOTAL) BY MOUTH DAILY. PLEASE SCHEDULE APPOINTMENT FOR REFILLS. 90 tablet 0  . aspirin 325 MG tablet Take 325 mg by mouth daily. Reported on 09/26/2015    . BAYER MICROLET LANCETS lancets Check blood sugars once per day. E11.65 100 each 2  . Blood Glucose Monitoring Suppl (BAYER CONTOUR NEXT MONITOR) w/Device KIT Use as directed. DX: E11.65 1 kit 0  . ciprofloxacin-hydrocortisone (CIPRO HC OTIC) otic suspension Place 3 drops into the left ear 2 (two) times daily. 10 mL 1  . clopidogrel (PLAVIX) 75 MG tablet Take 1 tablet (75 mg total) by mouth daily. 90 tablet 3  . colchicine 0.6 MG tablet Take 1 tablet (0.6 mg total) by mouth daily. Reported on  09/26/2015 90 tablet 3  . glucose blood (BAYER CONTOUR NEXT TEST) test strip Check blood sugar once per day. DX: E11.65 100 each 2  . hydrALAZINE (APRESOLINE) 25 MG tablet Take 1 tablet (25 mg total) by mouth 3 (three) times daily. 90 tablet 5  . isosorbide mononitrate (IMDUR) 60 MG 24 hr tablet Take 60 mg by mouth daily.    Marland Kitchen lamoTRIgine (LAMICTAL) 200 MG tablet Take 1 tablet (200 mg total) by mouth daily. Reported on 09/26/2015 30 tablet 2  . nitroGLYCERIN (NITROSTAT) 0.4 MG SL tablet Place 1 tablet (0.4 mg total) under the tongue every 5 (five) minutes as needed for chest pain. 25 tablet 2  . pantoprazole (PROTONIX) 40 MG tablet Take 1 tablet (40 mg total) by mouth daily. 90 tablet 3  . rosuvastatin (CRESTOR) 40 MG tablet Take 40 mg by mouth daily.     No current facility-administered medications for this visit.      Past Medical History:  Diagnosis Date  . Benign prostatic hypertrophy   . Bipolar affective disorder, mixed, moderate ([place])   . CAD (coronary artery disease)   . Diabetes mellitus    type II  . Gout   . Homocystinemia ([place])   . Hyperlipidemia   . Hypertension   . Nephrolithiasis   . Obesity   . Renal insufficiency   . Sleep apnea   . 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 ANKLE ARTHROSCOPY    .  ANKLE SURGERY     Left  . BASAL CELL CARCINOMA EXCISION  09/26/15  . CORONARY ARTERY BYPASS GRAFT  07/25/2009  . COSMETIC SURGERY     chin  . LIPOSUCTION    . NASAL SEPTUM SURGERY      Social History   Social History  . Marital status: Married    Spouse name: N/A  . Number of children: N/A  . Years of education: N/A   Occupational History  . Not on file.   Social History Main Topics  . Smoking status: Never Smoker  . Smokeless tobacco: Never Used  . Alcohol use Not on file  . Drug use: Unknown  . Sexual activity: Not on file   Other Topics Concern  . Not on file   Social History Narrative  .  No narrative on file    Family History  Problem Relation Age of Onset  . Depression Mother   . Cancer Mother     breast  . Diabetes Father   . Heart attack Father   . Diabetes Brother     ROS: no fevers or chills, productive cough, hemoptysis, dysphasia, odynophagia, melena, hematochezia, dysuria, hematuria, rash, seizure activity, orthopnea, PND, pedal edema, claudication. Remaining systems are negative.  Physical Exam: Well-developed well-nourished in no acute distress.  Skin is warm and dry.  HEENT is normal.  Neck is supple.  Chest is clear to auscultation with normal expansion.  Cardiovascular exam is regular rate and rhythm.  Abdominal exam nontender or distended. No masses palpated. Extremities show no edema. neuro grossly intact  ECG- personally reviewed  A/P  1  Kirk Ruths, MD

## 2016-05-20 ENCOUNTER — Ambulatory Visit: Payer: BLUE CROSS/BLUE SHIELD | Admitting: Cardiology

## 2016-06-03 LAB — HM DIABETES EYE EXAM

## 2016-06-09 ENCOUNTER — Telehealth: Payer: Self-pay | Admitting: Cardiology

## 2016-06-09 NOTE — Telephone Encounter (Signed)
New message    Calling for an appt next week with Dr. Jens Som. I advised pt of the next available appt and he states that he needs to be seen sooner and that Dr. Jens Som knows the reason. States that he would not like to give further details but that his last appt was canceled the day we had snow and did not open until 10 AM. He would like to be scheduled in sooner. He needs to a Tuesday or Thursday morning - does not want to see a PA only Dr. Jens Som.

## 2016-06-10 ENCOUNTER — Encounter: Payer: Self-pay | Admitting: Family Medicine

## 2016-06-11 NOTE — Telephone Encounter (Signed)
Left message for patient, Follow up scheduled 06-17-16 @ 10 am per dr Jens Som.

## 2016-06-12 NOTE — Progress Notes (Deleted)
HPI: FU CAD; also with HTN, HLD- uncontrolled, CRI-4, untreated DM, Bipolar disorder. He had a NSTEMI 06/12/09. Cath revealed severe CAD. Echocardiogram showed normal LV function. Patient underwent coronary artery bypass and graft on Jul 25, 2009. He has a liima to the LAD, saphenous vein graft to the diagonal, sequential saphenous vein graft to the second and third marginals and a saphenous vein graft to the acute marginal. His last echo was in 2016 and he had an EF of 55% with moderate LVH, severe LAE, and grade 2 DD. Nuclear study January 2018 showed ejection fraction 50%, mild mid anterior wall ischemia and patient treated medically. Toprol DCed as pt noted to have mobitz 1 prior to nuclear study. Holter monitor January 2018 showed sinus with first-degree AV block, PACs, PVCs, rare couplet, Mobitz 1 second-degree AV block and 2-1 AV block. Since last seen,   Current Outpatient Prescriptions  Medication Sig Dispense Refill  . amLODipine (NORVASC) 10 MG tablet TAKE 1 TABLET (10 MG TOTAL) BY MOUTH DAILY. PLEASE SCHEDULE APPOINTMENT FOR REFILLS. 90 tablet 0  . aspirin 325 MG tablet Take 325 mg by mouth daily. Reported on 09/26/2015    . BAYER MICROLET LANCETS lancets Check blood sugars once per day. E11.65 100 each 2  . Blood Glucose Monitoring Suppl (BAYER CONTOUR NEXT MONITOR) w/Device KIT Use as directed. DX: E11.65 1 kit 0  . ciprofloxacin-hydrocortisone (CIPRO HC OTIC) otic suspension Place 3 drops into the left ear 2 (two) times daily. 10 mL 1  . clopidogrel (PLAVIX) 75 MG tablet Take 1 tablet (75 mg total) by mouth daily. 90 tablet 3  . colchicine 0.6 MG tablet Take 1 tablet (0.6 mg total) by mouth daily. Reported on 09/26/2015 90 tablet 3  . glucose blood (BAYER CONTOUR NEXT TEST) test strip Check blood sugar once per day. DX: E11.65 100 each 2  . hydrALAZINE (APRESOLINE) 25 MG tablet Take 1 tablet (25 mg total) by mouth 3 (three) times daily. 90 tablet 5  . isosorbide mononitrate (IMDUR)  60 MG 24 hr tablet Take 60 mg by mouth daily.    Marland Kitchen lamoTRIgine (LAMICTAL) 200 MG tablet Take 1 tablet (200 mg total) by mouth daily. Reported on 09/26/2015 30 tablet 2  . nitroGLYCERIN (NITROSTAT) 0.4 MG SL tablet Place 1 tablet (0.4 mg total) under the tongue every 5 (five) minutes as needed for chest pain. 25 tablet 2  . pantoprazole (PROTONIX) 40 MG tablet Take 1 tablet (40 mg total) by mouth daily. 90 tablet 3  . rosuvastatin (CRESTOR) 40 MG tablet Take 40 mg by mouth daily.     No current facility-administered medications for this visit.      Past Medical History:  Diagnosis Date  . Benign prostatic hypertrophy   . Bipolar affective disorder, mixed, moderate ([place])   . CAD (coronary artery disease)   . Diabetes mellitus    type II  . Gout   . Homocystinemia ([place])   . Hyperlipidemia   . Hypertension   . Nephrolithiasis   . Obesity   . Renal insufficiency   . Sleep apnea   . 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 ANKLE ARTHROSCOPY    . ANKLE SURGERY     Left  . BASAL CELL CARCINOMA EXCISION  09/26/15  . CORONARY ARTERY BYPASS GRAFT  07/25/2009  . COSMETIC SURGERY     chin  . LIPOSUCTION    . NASAL SEPTUM  SURGERY      Social History   Social History  . Marital status: Married    Spouse name: N/A  . Number of children: N/A  . Years of education: N/A   Occupational History  . Not on file.   Social History Main Topics  . Smoking status: Never Smoker  . Smokeless tobacco: Never Used  . Alcohol use Not on file  . Drug use: Unknown  . Sexual activity: Not on file   Other Topics Concern  . Not on file   Social History Narrative  . No narrative on file    Family History  Problem Relation Age of Onset  . Depression Mother   . Cancer Mother     breast  . Diabetes Father   . Heart attack Father   . Diabetes Brother     ROS: no fevers or chills, productive cough, hemoptysis, dysphasia, odynophagia,  melena, hematochezia, dysuria, hematuria, rash, seizure activity, orthopnea, PND, pedal edema, claudication. Remaining systems are negative.  Physical Exam: Well-developed well-nourished in no acute distress.  Skin is warm and dry.  HEENT is normal.  Neck is supple.  Chest is clear to auscultation with normal expansion.  Cardiovascular exam is regular rate and rhythm.  Abdominal exam nontender or distended. No masses palpated. Extremities show no edema. neuro grossly intact  ECG- personally reviewed  A/P  1  Devin Ruths, MD

## 2016-06-17 ENCOUNTER — Encounter: Payer: Self-pay | Admitting: Cardiology

## 2016-06-17 ENCOUNTER — Ambulatory Visit: Payer: BLUE CROSS/BLUE SHIELD | Admitting: Cardiology

## 2016-06-17 ENCOUNTER — Ambulatory Visit (INDEPENDENT_AMBULATORY_CARE_PROVIDER_SITE_OTHER): Payer: BLUE CROSS/BLUE SHIELD | Admitting: Cardiology

## 2016-06-17 VITALS — BP 142/90 | HR 94 | Ht 70.0 in | Wt 255.0 lb

## 2016-06-17 DIAGNOSIS — N184 Chronic kidney disease, stage 4 (severe): Secondary | ICD-10-CM | POA: Diagnosis not present

## 2016-06-17 DIAGNOSIS — I10 Essential (primary) hypertension: Secondary | ICD-10-CM

## 2016-06-17 DIAGNOSIS — E7800 Pure hypercholesterolemia, unspecified: Secondary | ICD-10-CM | POA: Diagnosis not present

## 2016-06-17 DIAGNOSIS — R079 Chest pain, unspecified: Secondary | ICD-10-CM

## 2016-06-17 MED ORDER — ISOSORBIDE MONONITRATE ER 30 MG PO TB24: 90.0000 mg | ORAL_TABLET | Freq: Every day | ORAL | 3 refills | Status: DC

## 2016-06-17 MED ORDER — AMLODIPINE BESYLATE 10 MG PO TABS: 10.0000 mg | ORAL_TABLET | Freq: Every day | ORAL | 3 refills | Status: DC

## 2016-06-17 MED ORDER — ISOSORBIDE MONONITRATE ER 60 MG PO TB24: 90.0000 mg | ORAL_TABLET | Freq: Every day | ORAL | 3 refills | Status: DC

## 2016-06-17 NOTE — Patient Instructions (Addendum)
Medication Instructions:   INCREASE ISOSORBIDE TO 90 MG ONCE DAILY= 3 OF THE 30 MG TABLETS ONCE DAILY  Follow-Up:  Your physician recommends that you schedule a follow-up appointment in: 12 WEEKS WITH DR Jens Som

## 2016-06-17 NOTE — Progress Notes (Signed)
HPI: FU CAD; also with HTN, HLD- uncontrolled, CRI-4, untreated DM, Bipolar disorder. He had a NSTEMI 06/12/09. Cath revealed severe CAD. Echocardiogram showed normal LV function. Patient underwent coronary artery bypass and graft on Jul 25, 2009. He has a liima to the LAD, saphenous vein graft to the diagonal, sequential saphenous vein graft to the second and third marginals and a saphenous vein graft to the acute marginal. His last echo was in 2016 and he had an EF of 55% with moderate LVH, severe LAE, and grade 2 DD. Patient seen recently and noted to have chest pain. He was scheduled for a nuclear study. Prior to the study is electrocardiogram showed sinus rhythm with Mobitz 1 second-degree AV block. Beta blocker DCed. Nuclear study 04/03/2016 showed ejection fraction 50%. There is a small area of mid anterior wall ischemia. Holter monitor January 2018 showed sinus rhythm, first-degree AV block, PACs, PVCs, rare couplet, Mobitz 1 and 2-1 AV block. Since last seen, patient has chest pain with vigorous activities such as climbing stairs, exercise or walking fast. No dyspnea. He has not had presyncope or syncope.  Current Outpatient Prescriptions  Medication Sig Dispense Refill  . aspirin 325 MG tablet Take 325 mg by mouth daily. Reported on 09/26/2015    . BAYER MICROLET LANCETS lancets Check blood sugars once per day. E11.65 100 each 2  . Blood Glucose Monitoring Suppl (BAYER CONTOUR NEXT MONITOR) w/Device KIT Use as directed. DX: E11.65 1 kit 0  . ciprofloxacin-hydrocortisone (CIPRO HC OTIC) otic suspension Place 3 drops into the left ear 2 (two) times daily. 10 mL 1  . clopidogrel (PLAVIX) 75 MG tablet Take 1 tablet (75 mg total) by mouth daily. 90 tablet 3  . colchicine 0.6 MG tablet Take 1 tablet (0.6 mg total) by mouth daily. Reported on 09/26/2015 90 tablet 3  . glucose blood (BAYER CONTOUR NEXT TEST) test strip Check blood sugar once per day. DX: E11.65 100 each 2  . hydrALAZINE  (APRESOLINE) 25 MG tablet Take 1 tablet (25 mg total) by mouth 3 (three) times daily. 90 tablet 5  . isosorbide mononitrate (IMDUR) 60 MG 24 hr tablet Take 60 mg by mouth daily.    Marland Kitchen lamoTRIgine (LAMICTAL) 200 MG tablet Take 1 tablet (200 mg total) by mouth daily. Reported on 09/26/2015 30 tablet 2  . nitroGLYCERIN (NITROSTAT) 0.4 MG SL tablet Place 1 tablet (0.4 mg total) under the tongue every 5 (five) minutes as needed for chest pain. 25 tablet 2  . pantoprazole (PROTONIX) 40 MG tablet Take 1 tablet (40 mg total) by mouth daily. 90 tablet 3  . rosuvastatin (CRESTOR) 40 MG tablet Take 40 mg by mouth daily.     No current facility-administered medications for this visit.      Past Medical History:  Diagnosis Date  . Benign prostatic hypertrophy   . Bipolar affective disorder, mixed, moderate ([place])   . CAD (coronary artery disease)   . Diabetes mellitus    type II  . Gout   . 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 Hyperlipidemia   . Hypertension   . Nephrolithiasis   . Obesity   . Renal insufficiency   . Sleep apnea   . 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 ANKLE ARTHROSCOPY    . ANKLE SURGERY     Left  . BASAL CELL CARCINOMA EXCISION  09/26/15  . CORONARY ARTERY BYPASS GRAFT  07/25/2009  .  COSMETIC SURGERY     chin  . LIPOSUCTION    . NASAL SEPTUM SURGERY      Social History   Social History  . Marital status: Married    Spouse name: N/A  . Number of children: N/A  . Years of education: N/A   Occupational History  . Not on file.   Social History Main Topics  . Smoking status: Never Smoker  . Smokeless tobacco: Never Used  . Alcohol use Not on file  . Drug use: Unknown  . Sexual activity: Not on file   Other Topics Concern  . Not on file   Social History Narrative  . No narrative on file    Family History  Problem Relation Age of Onset  . Depression Mother   . Cancer Mother     breast  . Diabetes Father    . Heart attack Father   . Diabetes Brother     ROS: no fevers or chills, productive cough, hemoptysis, dysphasia, odynophagia, melena, hematochezia, dysuria, hematuria, rash, seizure activity, orthopnea, PND, pedal edema, claudication. Remaining systems are negative.  Physical Exam: Well-developed well-nourished in no acute distress.  Skin is warm and dry.  HEENT is normal.  Neck is supple. No bruits Chest is clear to auscultation with normal expansion.  Cardiovascular exam is regular rate and rhythm.  Abdominal exam nontender or distended. No masses palpated. Extremities show no edema. neuro grossly intact  A/P  1 coronary artery disease-continue aspirin, plavix and statin. Nuclear study with small area of anterior ischemia. Patient has chest pain with vigorous activities. We discussed options today including cardiac catheterization which I think would be appropriate now. However he has significant renal insufficiency and would be at risk for contrast nephropathy. He would therefore like to delay at this point. He would like to see nephrology to discuss further (and plans to see them in the near future) and then we will see back and consider catheterization if he is aggreeable. He would need to be admitted the night before the catheterization for hydration and we would need to limit dye. We discussed the risk of catheterization including contrast nephropathy. We also discussed the risk of undiagnosed coronary disease including myocardial infarction and death. Continue amlodipine. Increase isosorbide to 90 mg daily. I have discontinued his beta blocker previously due to bradycardia. Patient will contact us with any rest symptoms or worsening exertional symptoms.  2 hyperlipidemia-continue statin.  3 hypertension-blood pressure elevated. Increase isosorbide to 90 mg daily.  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 Beta blocker discontinued. Avoid AV nodal  blocking agents. May need pacemaker in the future.  5 stage IV chronic kidney disease-patient to schedule appointment with nephrology in the near future.  Kirk Ruths, MD

## 2016-06-30 ENCOUNTER — Other Ambulatory Visit: Payer: Self-pay | Admitting: Cardiology

## 2016-06-30 ENCOUNTER — Other Ambulatory Visit: Payer: Self-pay | Admitting: Family Medicine

## 2016-06-30 NOTE — Telephone Encounter (Signed)
lamoTRIgine (LAMICTAL) 200 MG tablet.  Last refill 04/07/16 and last office visit 04/11/16.  Okay to fill?

## 2016-06-30 NOTE — Telephone Encounter (Signed)
He really needs to establish with psychiatry for ongoing refills.  We do not want him to run out, so would refill once with one additional refill but needs to go ahead and set up referral as soon as possible.

## 2016-06-30 NOTE — Telephone Encounter (Signed)
Rx was found.  Refill was not sent.  Patient is aware that it should be filled by psychiatry.

## 2016-07-09 ENCOUNTER — Telehealth: Payer: Self-pay | Admitting: Family Medicine

## 2016-07-09 MED ORDER — PREDNISONE 20 MG PO TABS: 20.0000 mg | ORAL_TABLET | Freq: Two times a day (BID) | ORAL | 0 refills | Status: AC

## 2016-07-09 NOTE — Telephone Encounter (Signed)
Pt is calling to request an Rx for prednisone for gout flare.  States he hasn't been seen for this in a year.  Appt was offered but pt declined.  Then pt mentioned he would like something for the pain to help him sleep and I again advised an appt would probably be necessary.  Please send to [facility] on [address] if appropriate.

## 2016-07-09 NOTE — Telephone Encounter (Signed)
Prednisone 20 mg two daily for 5 days.  Monitor blood sugars closely.

## 2016-07-09 NOTE — Telephone Encounter (Signed)
Left detailed message on machine for patient with directions and to inform him that a Rx has been sent.

## 2016-07-15 ENCOUNTER — Telehealth: Payer: Self-pay | Admitting: Family Medicine

## 2016-07-15 MED ORDER — LAMOTRIGINE 200 MG PO TABS: 200.0000 mg | ORAL_TABLET | Freq: Every day | ORAL | 3 refills | Status: DC

## 2016-07-15 NOTE — Telephone Encounter (Signed)
Rx sent and Left message on machine for patient. 

## 2016-07-15 NOTE — Telephone Encounter (Signed)
Pt would like rachel to return his call before 1 pm today. Pt is needing a  Med his psychiatry had prescribed and psychiatry has left the practice. Pt has an appt with a new psychiatry in aug 2018

## 2016-07-15 NOTE — Telephone Encounter (Signed)
Refill OK until he can get in.

## 2016-07-15 NOTE — Telephone Encounter (Signed)
Patient would like to know if he can a refill of lamictal until he can get to his appointment? He states the prednisone worked well for his gout - thanks.  [facility]

## 2016-08-14 ENCOUNTER — Telehealth: Payer: Self-pay | Admitting: Cardiology

## 2016-08-14 DIAGNOSIS — N184 Chronic kidney disease, stage 4 (severe): Secondary | ICD-10-CM

## 2016-08-14 NOTE — Telephone Encounter (Signed)
Left message for patient, referral order placed and sent to admin to schedule.

## 2016-08-14 NOTE — Telephone Encounter (Signed)
Pt needs a referral to Dr Vonna KotykJay Pate,kidney specialist.

## 2016-08-25 NOTE — Progress Notes (Deleted)
HPI: FU CAD; also withHTN, HLD- uncontrolled, CRI-4, untreated DM, Bipolar disorder. He had a NSTEMI 06/12/09. Cath revealed severe CAD. Echocardiogram showed normal LV function. Patient underwent coronary artery bypass and graft on Jul 25, 2009. He has a LIMA to the LAD, saphenous vein graft to the diagonal, sequential saphenous vein graft to the second and third marginals and a saphenous vein graft to the acute marginal. His last echo was in 2016 and he had an EF of 55% with moderate LVH, severe LAE, and grade 2 DD. Patient seen recently and noted to have chest pain. He was scheduled for a nuclear study. Prior to the study is electrocardiogram showed sinus rhythm with Mobitz 1 second-degree AV block. Beta blocker DCed. Nuclear study 04/03/2016 showed ejection fraction 50%. There is a small area of mid anterior wall ischemia. Holter monitor January 2018 showed sinus rhythm, first-degree AV block, PACs, PVCs, rare couplet, Mobitz 1 and 2-1 AV block. At last office visit I recommended cardiac catheterization but the patient wanted to delay this because of his renal insufficiency and risk of contrast nephropathy. Since last seen,   Current Outpatient Prescriptions  Medication Sig Dispense Refill  . amLODipine (NORVASC) 10 MG tablet Take 1 tablet (10 mg total) by mouth daily. 90 tablet 3  . aspirin 325 MG tablet Take 325 mg by mouth daily. Reported on 09/26/2015    . BAYER MICROLET LANCETS lancets Check blood sugars once per day. E11.65 100 each 2  . Blood Glucose Monitoring Suppl (BAYER CONTOUR NEXT MONITOR) w/Device KIT Use as directed. DX: E11.65 1 kit 0  . ciprofloxacin-hydrocortisone (CIPRO HC OTIC) otic suspension Place 3 drops into the left ear 2 (two) times daily. 10 mL 1  . clopidogrel (PLAVIX) 75 MG tablet Take 1 tablet (75 mg total) by mouth daily. 90 tablet 3  . colchicine 0.6 MG tablet Take 1 tablet (0.6 mg total) by mouth daily. Reported on 09/26/2015 90 tablet 3  . glucose blood (BAYER  CONTOUR NEXT TEST) test strip Check blood sugar once per day. DX: E11.65 100 each 2  . hydrALAZINE (APRESOLINE) 25 MG tablet Take 1 tablet (25 mg total) by mouth 3 (three) times daily. 90 tablet 5  . isosorbide mononitrate (IMDUR) 30 MG 24 hr tablet Take 3 tablets (90 mg total) by mouth daily. 270 tablet 3  . lamoTRIgine (LAMICTAL) 200 MG tablet Take 1 tablet (200 mg total) by mouth daily. Reported on 09/26/2015 30 tablet 3  . nitroGLYCERIN (NITROSTAT) 0.4 MG SL tablet PLACE 1 TABLET (0.4 MG TOTAL) UNDER THE TONGUE EVERY 5 (FIVE) MINUTES AS NEEDED FOR CHEST PAIN. 25 tablet 2  . pantoprazole (PROTONIX) 40 MG tablet Take 1 tablet (40 mg total) by mouth daily. 90 tablet 3  . rosuvastatin (CRESTOR) 40 MG tablet Take 40 mg by mouth daily.     No current facility-administered medications for this visit.      Past Medical History:  Diagnosis Date  . Benign prostatic hypertrophy   . Bipolar affective disorder, mixed, moderate ([place])   . CAD (coronary artery disease)   . Diabetes mellitus    type II  . Gout   . 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 Hyperlipidemia   . Hypertension   . Nephrolithiasis   . Obesity   . Renal insufficiency   . Sleep apnea   . 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 ANKLE ARTHROSCOPY    .  ANKLE SURGERY     Left  . BASAL CELL CARCINOMA EXCISION  09/26/15  . CORONARY ARTERY BYPASS GRAFT  07/25/2009  . COSMETIC SURGERY     chin  . LIPOSUCTION    . NASAL SEPTUM SURGERY      Social History   Social History  . Marital status: Married    Spouse name: N/A  . Number of children: N/A  . Years of education: N/A   Occupational History  . Not on file.   Social History Main Topics  . Smoking status: Never Smoker  . Smokeless tobacco: Never Used  . Alcohol use Not on file  . Drug use: Unknown  . Sexual activity: Not on file   Other Topics Concern  . Not on file   Social History Narrative  . No narrative on  file    Family History  Problem Relation Age of Onset  . Depression Mother   . Cancer Mother        breast  . Diabetes Father   . Heart attack Father   . Diabetes Brother     ROS: no fevers or chills, productive cough, hemoptysis, dysphasia, odynophagia, melena, hematochezia, dysuria, hematuria, rash, seizure activity, orthopnea, PND, pedal edema, claudication. Remaining systems are negative.  Physical Exam: Well-developed well-nourished in no acute distress.  Skin is warm and dry.  HEENT is normal.  Neck is supple.  Chest is clear to auscultation with normal expansion.  Cardiovascular exam is regular rate and rhythm.  Abdominal exam nontender or distended. No masses palpated. Extremities show no edema. neuro grossly intact  ECG- personally reviewed  A/P  1  Kirk Ruths, MD

## 2016-08-28 ENCOUNTER — Telehealth: Payer: Self-pay | Admitting: Cardiology

## 2016-08-28 NOTE — Telephone Encounter (Signed)
Left message for patient, he will need to call the original provider that gave him the plaque. Dr Jens Somcrenshaw did not give the patient the handicap plaque.

## 2016-08-28 NOTE — Telephone Encounter (Signed)
Pt says he needs his handicap plaque renewed please.

## 2016-09-01 ENCOUNTER — Ambulatory Visit: Payer: BLUE CROSS/BLUE SHIELD | Admitting: Cardiology

## 2016-09-05 ENCOUNTER — Telehealth: Payer: Self-pay | Admitting: Cardiology

## 2016-09-05 NOTE — Telephone Encounter (Signed)
Patient calling, states that he is calling about his handicap placard, needs to get it renewed. Patient spoke with Dr. Ludwig Clarksrenshaw's nurse and was told that he could not have placard renewed here and that he had to go back to the physician who issues handicap placard. Patient states that his handicap placard was issued by  Dr. Ludwig Clarksrenshaw's PA (doesn't remember the name.) Patient is confused and does not know what he needs to do in order to get handicap placard renewed. Patient is aware that Stanton KidneyDebra is out of the office.

## 2016-09-05 NOTE — Telephone Encounter (Signed)
See prev tele message

## 2016-09-08 NOTE — Telephone Encounter (Signed)
Left message for patient will get signed next week and place in the mail to him

## 2016-09-15 LAB — BASIC METABOLIC PANEL
BUN: 61 — AB (ref 4–21)
Creatinine: 3.7 — AB (ref 0.6–1.3)
Glucose: 326
Potassium: 4.6 (ref 3.4–5.3)
Sodium: 137 (ref 137–147)

## 2016-09-15 LAB — VITAMIN D 25 HYDROXY (VIT D DEFICIENCY, FRACTURES): VIT D 25 HYDROXY: 20

## 2016-09-15 LAB — CBC AND DIFFERENTIAL
HCT: 46 (ref 41–53)
HEMOGLOBIN: 15.7 (ref 13.5–17.5)
Neutrophils Absolute: 7
PLATELETS: 261 (ref 150–399)
WBC: 10.8

## 2016-09-16 NOTE — Telephone Encounter (Signed)
Handicap plaque paperwrok placed in the mail to the patient. Patient made aware.

## 2016-09-19 ENCOUNTER — Telehealth: Payer: Self-pay | Admitting: Physician Assistant

## 2016-09-19 NOTE — Telephone Encounter (Signed)
Received records from [facility] for appointment on 09/23/16 with Azalee CourseHao Meng, PA.  Records put with Hao's schedule for 09/23/16. lp

## 2016-09-22 NOTE — Progress Notes (Signed)
Cardiology Office Note    Date:  09/23/2016   ID:  [name], DOB 10-08-1950, MRN [id number]  PCP:  [name], MD  Cardiologist:  Dr. Stanford Breed   Chief Complaint  Patient presents with  . Follow-up  . Coronary Artery Disease    History of Present Illness:  FU CAD; also with PMH of HTN, HLD, CKD stage IV, DM, bipolar disorder. He had a NSTEMI in 06/2009, cardiac catheterization at that time showed severe native CAD. He eventually underwent bypass procedure on 07/25/2009 (CABG LIMA to LAD, SVG to diagonal, SVG to OM 2/OM 3, SVG to acute marginal). Last echocardiogram in 2016 showed EF 55%, moderate LVH, severe LAE, grade 2 DD. His beta blocker was discontinued previously due to presence of Mobitz 1 second-degree AV block on EKG. Nuclear study performed on 04/03/2016 showed EF 55%, small area of mid anterior wall ischemia. Holter monitor obtained in January 2018 shows sinus rhythm, first-degree AV block, PACs, PVCs, rare couplets, Mobitz 1 and 2-1 AV block. At last office visit cardiac catheterization was recommended because of continuing exertional chest pain. However patient delayed because of risk of contrast nephropathy. Since last seen patient can occasionally have chest pain with vigorous activities in the evening. He has not noticed this in the morning after taking his a.m. medications. He denies dyspnea, orthopnea or syncope.    Past Medical History:  Diagnosis Date  . Benign prostatic hypertrophy   . Bipolar affective disorder, mixed, moderate ([place])   . CAD (coronary artery disease)   . Diabetes mellitus    type II  . Gout   . 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 Hyperlipidemia   . Hypertension   . Nephrolithiasis   . Obesity   . Renal insufficiency   . Sleep apnea   . 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 ANKLE ARTHROSCOPY    . ANKLE SURGERY     Left  . BASAL CELL CARCINOMA EXCISION  09/26/15  . CORONARY  ARTERY BYPASS GRAFT  07/25/2009  . COSMETIC SURGERY     chin  . LIPOSUCTION    . NASAL SEPTUM SURGERY      Current Medications: Outpatient Medications Prior to Visit  Medication Sig Dispense Refill  . aspirin 325 MG tablet Take 325 mg by mouth daily. Reported on 09/26/2015    . BAYER MICROLET LANCETS lancets Check blood sugars once per day. E11.65 100 each 2  . Blood Glucose Monitoring Suppl (BAYER CONTOUR NEXT MONITOR) w/Device KIT Use as directed. DX: E11.65 1 kit 0  . ciprofloxacin-hydrocortisone (CIPRO HC OTIC) otic suspension Place 3 drops into the left ear 2 (two) times daily. 10 mL 1  . clopidogrel (PLAVIX) 75 MG tablet Take 1 tablet (75 mg total) by mouth daily. 90 tablet 3  . colchicine 0.6 MG tablet Take 1 tablet (0.6 mg total) by mouth daily. Reported on 09/26/2015 90 tablet 3  . glucose blood (BAYER CONTOUR NEXT TEST) test strip Check blood sugar once per day. DX: E11.65 100 each 2  . hydrALAZINE (APRESOLINE) 25 MG tablet Take 1 tablet (25 mg total) by mouth 3 (three) times daily. (Patient taking differently: Take 50 mg by mouth 3 (three) times daily. ) 90 tablet 5  . isosorbide mononitrate (IMDUR) 30 MG 24 hr tablet Take 3 tablets (90 mg total) by mouth daily. 270 tablet 3  . lamoTRIgine (LAMICTAL) 200 MG tablet Take 1 tablet (200 mg total)  by mouth daily. Reported on 09/26/2015 30 tablet 3  . nitroGLYCERIN (NITROSTAT) 0.4 MG SL tablet PLACE 1 TABLET (0.4 MG TOTAL) UNDER THE TONGUE EVERY 5 (FIVE) MINUTES AS NEEDED FOR CHEST PAIN. 25 tablet 2  . pantoprazole (PROTONIX) 40 MG tablet Take 1 tablet (40 mg total) by mouth daily. 90 tablet 3  . rosuvastatin (CRESTOR) 40 MG tablet Take 40 mg by mouth daily.    Marland Kitchen amLODipine (NORVASC) 10 MG tablet Take 1 tablet (10 mg total) by mouth daily. 90 tablet 3   No facility-administered medications prior to visit.      Allergies:   Nsaids   Social History   Social History  . Marital status: Married    Spouse name: N/A  . Number of  children: N/A  . Years of education: N/A   Social History Main Topics  . Smoking status: Never Smoker  . Smokeless tobacco: Never Used  . Alcohol use None  . Drug use: Unknown  . Sexual activity: Not Asked   Other Topics Concern  . None   Social History Narrative  . None     Family History:  The patient's family history includes Cancer in his mother; Depression in his mother; Diabetes in his brother and father; Heart attack in his father.   ROS:   Back pain. ROS All other systems reviewed and are negative.   PHYSICAL EXAM:   VS:  BP (!) 126/94   Pulse 90   Ht _0  (1.778 m)   Wt 115.2 kg (254 lb)   BMI 36.45 kg/m    GEN: Well nourished, well developed, in no acute distress  HEENT: normal  Neck: no JVD, carotid bruits, or masses Cardiac: RRR; no murmurs, rubs, or gallops,no edema  Respiratory:  clear to auscultation bilaterally, normal work of breathing GI: soft, nontender, nondistended, + BS, no bruit MS: no deformity or atrophy  Skin: warm and dry, no rash Neuro:  Alert and Oriented x 3, Strength and sensation are intact Psych: euthymic mood, full affect  Wt Readings from Last 3 Encounters:  09/23/16 115.2 kg (254 lb)  06/17/16 115.7 kg (255 lb)  04/03/16 117 kg (258 lb)      Studies/Labs Reviewed:    Lipid Panel    Component Value Date/Time   CHOL 253 (H) 09/07/2015 0907   TRIG 195.0 (H) 09/07/2015 0907   TRIG 103 02/11/2006 0846   HDL 43.00 09/07/2015 0907   CHOLHDL 6 09/07/2015 0907   VLDL 39.0 09/07/2015 0907   LDLCALC 171 (H) 09/07/2015 0907   LDLDIRECT 135.1 02/21/2008 0752    Additional studies/ records that were reviewed today include:    Cath 06/12/2009      CABG by Dr. Roxy Manns 07/25/2009     Echo 12/14/2014 LV EF: 55% -   60%  Study Conclusions  - Procedure narrative: Transthoracic echocardiography. Image   quality was poor. The study was technically difficult.   Intravenous contrast (Definity) was administered. - Left  ventricle: The cavity size was normal. There was moderate   concentric hypertrophy. Systolic function was normal. The   estimated ejection fraction was in the range of 55% to 60%. Wall   motion was normal; there were no regional wall motion   abnormalities. Features are consistent with a pseudonormal left   ventricular filling pattern, with concomitant abnormal relaxation   and increased filling pressure (grade 2 diastolic dysfunction). - Mitral valve: There was trivial regurgitation. - Left atrium: The atrium was severely dilated. -  Right ventricle: The cavity size was normal. Wall thickness was   normal. Systolic function was mildly reduced.    Myoview 04/03/2016 Study Highlights     The left ventricular ejection fraction is mildly decreased (45-54%).  Nuclear stress EF: 50%.  There was no ST segment deviation noted during stress.  Defect 1: There is a small defect of mild severity present in the apical anterior location.  Findings consistent with prior myocardial infarction with peri-infarct ischemia.  This is an intermediate risk study.   The patient has baseline Wenckebach periods that progressed into 2:1 block after regadenoson infusion and persisted for 19 minutes into recovery. Then back to intermittent Wenckebach periods.   There is a small area, mild severity, reversible defect in the mid anterior wall consistent with ischemia (SDS = 2).  LVEF calculated at 50% but visually appears higher.    Electrocardiogram today shows sinus rhythm with first-degree AV block, right bundle branch block, left ventricular hypertrophy, lateral T-wave inversion. A/P  1 coronary artery disease-continue aspirin, Plavix and statin. Previous nuclear study showed small area of anterior ischemia. We will continue antianginals including nitrates and amlodipine. He is noticing chest pain with activities in the evening. I will change his isosorbide to 60 mg twice daily. We are unable to add  beta-blockade given history of bradycardia. We discussed options of cardiac catheterization today. He would be at risk for contrast nephropathy and he is concerned about this appropriately. His symptoms are not progressing. He would prefer to avoid catheterization at this time. If his symptoms worsen we will revisit this. He will contact us if his symptoms increase in frequency or with less activities.  2 hyperlipidemia-continue statin. Check lipids and liver.  3 hypertension-blood pressure is controlled. Continue present medications.  4 bradycardia-beta blocker was discontinued previously because of Mobitz 1 and 2-1 AV block noted on monitor. We will continue to avoid. There is no history of presyncope or syncope. Patient may require a pacemaker in the future.  5 chronic stage IV kidney disease-followed by nephrology. Would be at risk for contrast nephropathy with catheterization.   Kirk Ruths, MD

## 2016-09-23 ENCOUNTER — Encounter: Payer: Self-pay | Admitting: Cardiology

## 2016-09-23 ENCOUNTER — Ambulatory Visit (INDEPENDENT_AMBULATORY_CARE_PROVIDER_SITE_OTHER): Payer: BLUE CROSS/BLUE SHIELD | Admitting: Cardiology

## 2016-09-23 VITALS — BP 126/94 | HR 90 | Ht 70.0 in | Wt 254.0 lb

## 2016-09-23 DIAGNOSIS — E7800 Pure hypercholesterolemia, unspecified: Secondary | ICD-10-CM

## 2016-09-23 DIAGNOSIS — I10 Essential (primary) hypertension: Secondary | ICD-10-CM | POA: Diagnosis not present

## 2016-09-23 DIAGNOSIS — I25118 Atherosclerotic heart disease of native coronary artery with other forms of angina pectoris: Secondary | ICD-10-CM

## 2016-09-23 DIAGNOSIS — R072 Precordial pain: Secondary | ICD-10-CM | POA: Diagnosis not present

## 2016-09-23 LAB — LIPID PANEL
CHOLESTEROL TOTAL: 192 mg/dL (ref 100–199)
Chol/HDL Ratio: 5.6 ratio — ABNORMAL HIGH (ref 0.0–5.0)
HDL: 34 mg/dL — AB (ref >39)
LDL Calculated: 102 mg/dL — ABNORMAL HIGH (ref 0–99)
TRIGLYCERIDES: 281 mg/dL — AB (ref 0–149)
VLDL Cholesterol Cal: 56 mg/dL — ABNORMAL HIGH (ref 5–40)

## 2016-09-23 LAB — HEPATIC FUNCTION PANEL
ALK PHOS: 123 IU/L — AB (ref 39–117)
ALT: 34 IU/L (ref 0–44)
AST: 19 IU/L (ref 0–40)
Albumin: 4.4 g/dL (ref 3.6–4.8)
BILIRUBIN TOTAL: 0.3 mg/dL (ref 0.0–1.2)
BILIRUBIN, DIRECT: 0.15 mg/dL (ref 0.00–0.40)
Total Protein: 6.6 g/dL (ref 6.0–8.5)

## 2016-09-23 MED ORDER — ISOSORBIDE MONONITRATE ER 60 MG PO TB24: 60.0000 mg | ORAL_TABLET | Freq: Two times a day (BID) | ORAL | 3 refills | Status: DC | PRN

## 2016-09-23 NOTE — Patient Instructions (Addendum)
Medication Instructions:   INCREASE ISOSORBIDE TO 60 MG TWICE DAILY= 2 OF THE 30 MG TABLETS TWICE DAILY  Follow-Up:  Your physician recommends that you schedule a follow-up appointment in: 4 MONTHS WITH DR Jens SomRENSHAW   Any Other Special Instructions Will Be Listed Below (If Applicable).

## 2016-09-25 ENCOUNTER — Telehealth: Payer: Self-pay | Admitting: *Deleted

## 2016-09-25 ENCOUNTER — Encounter: Payer: Self-pay | Admitting: Family Medicine

## 2016-09-25 MED ORDER — ROSUVASTATIN CALCIUM 40 MG PO TABS: 40.0000 mg | ORAL_TABLET | Freq: Every day | ORAL | 3 refills | Status: AC

## 2016-09-25 NOTE — Telephone Encounter (Signed)
-----   Message from Lewayne BuntingBrian S Crenshaw, MD sent at 09/23/2016  5:19 PM EDT ----- Add zetia 10 mg daily; lipids and liver 4 weeks Olga MillersBrian Crenshaw

## 2016-09-25 NOTE — Telephone Encounter (Signed)
Spoke with pt, he is aware of his cholesterol results. He reports he has not taken crestor in at least 6 months. He just never refilled the script. He did not have any side effects from the medication. He will restart the crestor at the previous dose of 40 mg once daily. New script sent to the pharmacy.

## 2016-10-26 ENCOUNTER — Encounter (HOSPITAL_COMMUNITY): Payer: Self-pay | Admitting: Internal Medicine

## 2016-10-26 ENCOUNTER — Inpatient Hospital Stay (HOSPITAL_COMMUNITY)
Admission: AD | Admit: 2016-10-26 | Discharge: 2016-10-30 | DRG: 280 | Disposition: A | Discharge status: Home / Self Care | Payer: BLUE CROSS/BLUE SHIELD | Source: Other Acute Inpatient Hospital | Attending: Internal Medicine | Admitting: Internal Medicine

## 2016-10-26 DIAGNOSIS — E1165 Type 2 diabetes mellitus with hyperglycemia: Secondary | ICD-10-CM | POA: Diagnosis present

## 2016-10-26 DIAGNOSIS — E038 Other specified hypothyroidism: Secondary | ICD-10-CM | POA: Diagnosis not present

## 2016-10-26 DIAGNOSIS — I5031 Acute diastolic (congestive) heart failure: Secondary | ICD-10-CM | POA: Diagnosis present

## 2016-10-26 DIAGNOSIS — Z7189 Other specified counseling: Secondary | ICD-10-CM

## 2016-10-26 DIAGNOSIS — Z7982 Long term (current) use of aspirin: Secondary | ICD-10-CM

## 2016-10-26 DIAGNOSIS — Z8719 Personal history of other diseases of the digestive system: Secondary | ICD-10-CM

## 2016-10-26 DIAGNOSIS — I340 Nonrheumatic mitral (valve) insufficiency: Secondary | ICD-10-CM | POA: Diagnosis present

## 2016-10-26 DIAGNOSIS — G4733 Obstructive sleep apnea (adult) (pediatric): Secondary | ICD-10-CM | POA: Diagnosis present

## 2016-10-26 DIAGNOSIS — I441 Atrioventricular block, second degree: Secondary | ICD-10-CM | POA: Diagnosis present

## 2016-10-26 DIAGNOSIS — F3162 Bipolar disorder, current episode mixed, moderate: Secondary | ICD-10-CM | POA: Diagnosis present

## 2016-10-26 DIAGNOSIS — G8929 Other chronic pain: Secondary | ICD-10-CM | POA: Diagnosis present

## 2016-10-26 DIAGNOSIS — E039 Hypothyroidism, unspecified: Secondary | ICD-10-CM | POA: Diagnosis present

## 2016-10-26 DIAGNOSIS — E785 Hyperlipidemia, unspecified: Secondary | ICD-10-CM | POA: Diagnosis present

## 2016-10-26 DIAGNOSIS — E8881 Metabolic syndrome: Secondary | ICD-10-CM | POA: Diagnosis present

## 2016-10-26 DIAGNOSIS — Z951 Presence of aortocoronary bypass graft: Secondary | ICD-10-CM

## 2016-10-26 DIAGNOSIS — R079 Chest pain, unspecified: Secondary | ICD-10-CM | POA: Diagnosis present

## 2016-10-26 DIAGNOSIS — I25720 Atherosclerosis of autologous artery coronary artery bypass graft(s) with unstable angina pectoris: Secondary | ICD-10-CM | POA: Diagnosis present

## 2016-10-26 DIAGNOSIS — I25110 Atherosclerotic heart disease of native coronary artery with unstable angina pectoris: Secondary | ICD-10-CM | POA: Diagnosis present

## 2016-10-26 DIAGNOSIS — E784 Other hyperlipidemia: Secondary | ICD-10-CM | POA: Diagnosis present

## 2016-10-26 DIAGNOSIS — Z79899 Other long term (current) drug therapy: Secondary | ICD-10-CM

## 2016-10-26 DIAGNOSIS — Z8673 Personal history of transient ischemic attack (TIA), and cerebral infarction without residual deficits: Secondary | ICD-10-CM

## 2016-10-26 DIAGNOSIS — I214 Non-ST elevation (NSTEMI) myocardial infarction: Principal | ICD-10-CM | POA: Diagnosis present

## 2016-10-26 DIAGNOSIS — M10371 Gout due to renal impairment, right ankle and foot: Secondary | ICD-10-CM | POA: Diagnosis present

## 2016-10-26 DIAGNOSIS — R011 Cardiac murmur, unspecified: Secondary | ICD-10-CM | POA: Diagnosis present

## 2016-10-26 DIAGNOSIS — E1122 Type 2 diabetes mellitus with diabetic chronic kidney disease: Secondary | ICD-10-CM | POA: Diagnosis present

## 2016-10-26 DIAGNOSIS — E1129 Type 2 diabetes mellitus with other diabetic kidney complication: Secondary | ICD-10-CM | POA: Diagnosis present

## 2016-10-26 DIAGNOSIS — Z803 Family history of malignant neoplasm of breast: Secondary | ICD-10-CM

## 2016-10-26 DIAGNOSIS — Z818 Family history of other mental and behavioral disorders: Secondary | ICD-10-CM

## 2016-10-26 DIAGNOSIS — I2510 Atherosclerotic heart disease of native coronary artery without angina pectoris: Secondary | ICD-10-CM | POA: Diagnosis not present

## 2016-10-26 DIAGNOSIS — Z87442 Personal history of urinary calculi: Secondary | ICD-10-CM

## 2016-10-26 DIAGNOSIS — M5116 Intervertebral disc disorders with radiculopathy, lumbar region: Secondary | ICD-10-CM | POA: Diagnosis present

## 2016-10-26 DIAGNOSIS — E669 Obesity, unspecified: Secondary | ICD-10-CM | POA: Diagnosis present

## 2016-10-26 DIAGNOSIS — N184 Chronic kidney disease, stage 4 (severe): Secondary | ICD-10-CM | POA: Diagnosis present

## 2016-10-26 DIAGNOSIS — Z6834 Body mass index (BMI) 34.0-34.9, adult: Secondary | ICD-10-CM

## 2016-10-26 DIAGNOSIS — E1159 Type 2 diabetes mellitus with other circulatory complications: Secondary | ICD-10-CM | POA: Diagnosis present

## 2016-10-26 DIAGNOSIS — I4510 Unspecified right bundle-branch block: Secondary | ICD-10-CM | POA: Diagnosis present

## 2016-10-26 DIAGNOSIS — I130 Hypertensive heart and chronic kidney disease with heart failure and stage 1 through stage 4 chronic kidney disease, or unspecified chronic kidney disease: Secondary | ICD-10-CM | POA: Diagnosis present

## 2016-10-26 DIAGNOSIS — E1169 Type 2 diabetes mellitus with other specified complication: Secondary | ICD-10-CM | POA: Diagnosis present

## 2016-10-26 DIAGNOSIS — T383X6A Underdosing of insulin and oral hypoglycemic [antidiabetic] drugs, initial encounter: Secondary | ICD-10-CM | POA: Diagnosis present

## 2016-10-26 DIAGNOSIS — G4730 Sleep apnea, unspecified: Secondary | ICD-10-CM | POA: Diagnosis not present

## 2016-10-26 DIAGNOSIS — I119 Hypertensive heart disease without heart failure: Secondary | ICD-10-CM | POA: Diagnosis not present

## 2016-10-26 DIAGNOSIS — I509 Heart failure, unspecified: Secondary | ICD-10-CM

## 2016-10-26 DIAGNOSIS — Z7902 Long term (current) use of antithrombotics/antiplatelets: Secondary | ICD-10-CM

## 2016-10-26 DIAGNOSIS — I10 Essential (primary) hypertension: Secondary | ICD-10-CM | POA: Diagnosis not present

## 2016-10-26 DIAGNOSIS — Z91128 Patient's intentional underdosing of medication regimen for other reason: Secondary | ICD-10-CM

## 2016-10-26 DIAGNOSIS — Z8249 Family history of ischemic heart disease and other diseases of the circulatory system: Secondary | ICD-10-CM

## 2016-10-26 DIAGNOSIS — Z833 Family history of diabetes mellitus: Secondary | ICD-10-CM

## 2016-10-26 DIAGNOSIS — Z85828 Personal history of other malignant neoplasm of skin: Secondary | ICD-10-CM

## 2016-10-26 DIAGNOSIS — I252 Old myocardial infarction: Secondary | ICD-10-CM

## 2016-10-26 DIAGNOSIS — IMO0002 Reserved for concepts with insufficient information to code with codable children: Secondary | ICD-10-CM

## 2016-10-26 DIAGNOSIS — N2581 Secondary hyperparathyroidism of renal origin: Secondary | ICD-10-CM | POA: Diagnosis present

## 2016-10-26 DIAGNOSIS — Z886 Allergy status to analgesic agent status: Secondary | ICD-10-CM

## 2016-10-26 HISTORY — DX: Chronic kidney disease, stage 4 (severe): N18.4

## 2016-10-26 HISTORY — DX: Unspecified thoracic, thoracolumbar and lumbosacral intervertebral disc disorder: M51.9

## 2016-10-26 HISTORY — DX: Acute pancreatitis without necrosis or infection, unspecified: K85.90

## 2016-10-26 LAB — BASIC METABOLIC PANEL
ANION GAP: 11 (ref 5–15)
BUN: 38 mg/dL — ABNORMAL HIGH (ref 6–20)
CALCIUM: 8.8 mg/dL — AB (ref 8.9–10.3)
CO2: 22 mmol/L (ref 22–32)
Chloride: 102 mmol/L (ref 101–111)
Creatinine, Ser: 3.29 mg/dL — ABNORMAL HIGH (ref 0.61–1.24)
GFR calc Af Amer: 21 mL/min — ABNORMAL LOW (ref >60)
GFR, EST NON AFRICAN AMERICAN: 19 mL/min — AB (ref >60)
GLUCOSE: 213 mg/dL — AB (ref 65–99)
Potassium: 4.2 mmol/L (ref 3.5–5.1)
SODIUM: 135 mmol/L (ref 135–145)

## 2016-10-26 LAB — HEMOGLOBIN A1C
HEMOGLOBIN A1C: 12 % — AB (ref 4.8–5.6)
MEAN PLASMA GLUCOSE: 297.7 mg/dL

## 2016-10-26 LAB — GLUCOSE, CAPILLARY
GLUCOSE-CAPILLARY: 220 mg/dL — AB (ref 65–99)
Glucose-Capillary: 204 mg/dL — ABNORMAL HIGH (ref 65–99)

## 2016-10-26 LAB — TSH: TSH: 6.298 u[IU]/mL — ABNORMAL HIGH (ref 0.350–4.500)

## 2016-10-26 LAB — TROPONIN I: Troponin I: 36.64 ng/mL (ref <0.03)

## 2016-10-26 MED ORDER — ROSUVASTATIN CALCIUM 10 MG PO TABS
40.0000 mg | ORAL_TABLET | Freq: Every day | ORAL | Status: DC
  Administered 2016-10-27 – 2016-10-30 (×4): 40 mg via ORAL
  Filled 2016-10-26 (×4): qty 4

## 2016-10-26 MED ORDER — HEPARIN (PORCINE) IN NACL 100-0.45 UNIT/ML-% IJ SOLN
1600.0000 [IU]/h | INTRAMUSCULAR | Status: DC
  Administered 2016-10-26: 1400 [IU]/h via INTRAVENOUS
  Administered 2016-10-27: 1600 [IU]/h via INTRAVENOUS
  Filled 2016-10-26 (×2): qty 250

## 2016-10-26 MED ORDER — SODIUM CHLORIDE 0.9 % IV SOLN
INTRAVENOUS | Status: DC
  Administered 2016-10-26 – 2016-10-27 (×2): via INTRAVENOUS

## 2016-10-26 MED ORDER — INSULIN ASPART 100 UNIT/ML ~~LOC~~ SOLN
0.0000 [IU] | Freq: Three times a day (TID) | SUBCUTANEOUS | Status: DC
  Administered 2016-10-27: 4 [IU] via SUBCUTANEOUS

## 2016-10-26 MED ORDER — ACETAMINOPHEN 325 MG PO TABS
650.0000 mg | ORAL_TABLET | ORAL | Status: DC | PRN
  Administered 2016-10-27 – 2016-10-29 (×4): 650 mg via ORAL
  Filled 2016-10-26 (×4): qty 2

## 2016-10-26 MED ORDER — SODIUM CHLORIDE 0.9% FLUSH: 3.0000 mL | INTRAVENOUS | Status: DC | PRN

## 2016-10-26 MED ORDER — ONDANSETRON HCL 4 MG/2ML IJ SOLN
4.0000 mg | Freq: Four times a day (QID) | INTRAMUSCULAR | Status: DC | PRN
  Administered 2016-10-26: 4 mg via INTRAVENOUS
  Filled 2016-10-26: qty 2

## 2016-10-26 MED ORDER — AMLODIPINE BESYLATE 10 MG PO TABS
10.0000 mg | ORAL_TABLET | Freq: Every day | ORAL | Status: DC
  Administered 2016-10-27 – 2016-10-30 (×4): 10 mg via ORAL
  Filled 2016-10-26 (×4): qty 1

## 2016-10-26 MED ORDER — ISOSORBIDE MONONITRATE ER 60 MG PO TB24
60.0000 mg | ORAL_TABLET | Freq: Two times a day (BID) | ORAL | Status: DC | PRN
  Administered 2016-10-28: 60 mg via ORAL
  Filled 2016-10-26: qty 1

## 2016-10-26 MED ORDER — HYDRALAZINE HCL 50 MG PO TABS
50.0000 mg | ORAL_TABLET | Freq: Three times a day (TID) | ORAL | Status: DC
  Administered 2016-10-27 – 2016-10-29 (×7): 50 mg via ORAL
  Filled 2016-10-26 (×7): qty 1

## 2016-10-26 MED ORDER — PANTOPRAZOLE SODIUM 40 MG PO TBEC
40.0000 mg | DELAYED_RELEASE_TABLET | Freq: Every day | ORAL | Status: DC
  Administered 2016-10-27 – 2016-10-30 (×4): 40 mg via ORAL
  Filled 2016-10-26 (×4): qty 1

## 2016-10-26 MED ORDER — LAMOTRIGINE 100 MG PO TABS
200.0000 mg | ORAL_TABLET | Freq: Every day | ORAL | Status: DC
  Administered 2016-10-26 – 2016-10-27 (×2): 200 mg via ORAL
  Filled 2016-10-26 (×2): qty 2

## 2016-10-26 MED ORDER — SODIUM CHLORIDE 0.9 % IV SOLN: 250.0000 mL | INTRAVENOUS | Status: DC | PRN

## 2016-10-26 MED ORDER — NITROGLYCERIN IN D5W 200-5 MCG/ML-% IV SOLN
0.0000 ug/min | INTRAVENOUS | Status: DC
  Administered 2016-10-26: 5 ug/min via INTRAVENOUS
  Filled 2016-10-26: qty 250

## 2016-10-26 MED ORDER — ASPIRIN 325 MG PO TABS
325.0000 mg | ORAL_TABLET | Freq: Every day | ORAL | Status: DC
  Filled 2016-10-26: qty 1

## 2016-10-26 MED ORDER — HEPARIN BOLUS VIA INFUSION
4000.0000 [IU] | Freq: Once | INTRAVENOUS | Status: DC
  Filled 2016-10-26: qty 4000

## 2016-10-26 MED ORDER — SODIUM CHLORIDE 0.9% FLUSH
3.0000 mL | Freq: Two times a day (BID) | INTRAVENOUS | Status: DC
  Administered 2016-10-26 – 2016-10-29 (×4): 3 mL via INTRAVENOUS

## 2016-10-26 MED ORDER — INSULIN ASPART 100 UNIT/ML ~~LOC~~ SOLN
0.0000 [IU] | Freq: Every day | SUBCUTANEOUS | Status: DC
  Administered 2016-10-26: 2 [IU] via SUBCUTANEOUS

## 2016-10-26 MED ORDER — METOPROLOL TARTRATE 12.5 MG HALF TABLET: 12.5000 mg | ORAL_TABLET | Freq: Two times a day (BID) | ORAL | Status: DC

## 2016-10-26 MED ORDER — COLCHICINE 0.6 MG PO TABS
0.6000 mg | ORAL_TABLET | Freq: Every day | ORAL | Status: DC
  Filled 2016-10-26: qty 1

## 2016-10-26 MED ORDER — CLOPIDOGREL BISULFATE 75 MG PO TABS
75.0000 mg | ORAL_TABLET | Freq: Every day | ORAL | Status: DC
  Administered 2016-10-27 – 2016-10-30 (×4): 75 mg via ORAL
  Filled 2016-10-26 (×4): qty 1

## 2016-10-26 MED ORDER — ZOLPIDEM TARTRATE 5 MG PO TABS: 5.0000 mg | ORAL_TABLET | Freq: Every evening | ORAL | Status: DC | PRN

## 2016-10-26 NOTE — Progress Notes (Addendum)
ANTICOAGULATION CONSULT NOTE  Pharmacy Consult for heparin  Indication: chest pain/ACS  Allergies  Allergen Reactions  . Nsaids     Patient Measurements: Height: 5\' 11"  (180.3 cm) Weight: 253 lb 12.8 oz (115.1 kg) IBW/kg (Calculated) : 75.3 Heparin Dosing Weight: 100 kg  Vital Signs: Temp: 97.8 F (36.6 C) (08/19 1943) Temp Source: Oral (08/19 1943) BP: 119/82 (08/19 1943) Pulse Rate: 96 (08/19 1943)  Labs: No results for input(s): HGB, HCT, PLT, APTT, LABPROT, INR, HEPARINUNFRC, HEPRLOWMOCWT, CREATININE, CKTOTAL, CKMB, TROPONINI in the last 72 hours.  CrCl cannot be calculated (Patient's most recent lab result is older than the maximum 21 days allowed.).   Medical History: Past Medical History:  Diagnosis Date  . Back pain    ruptured disc with sciatica  . Benign prostatic hypertrophy   . Bipolar affective disorder, mixed, moderate ([name])   . CAD (coronary artery disease)   . Diabetes mellitus    type II  . Gout   . Homocystinemia ([name])   . Hyperlipidemia   . Hypertension   . Nephrolithiasis   . Obesity   . Renal insufficiency   . Sleep apnea   . 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 Was receiving heparin infusion at 1000 units/hr prior to arrival. No known a/c PTA.   Goal of Therapy:  Heparin level 0.3-0.7 units/ml Monitor platelets by anticoagulation protocol: Yes   Plan:  1. Increase heparin infusion to 1400 units/hr  2. Heparin level in 8 hours  3. Follow up on cardiology plans    Pollyann Samples, PharmD, BCPS 10/26/2016, 9:00 PM

## 2016-10-26 NOTE — Progress Notes (Signed)
Patient refused CPAP for tonight. Patient stated he hasn't worn one in over a year at home and does not think he can handle one tonight. Patient requested that RT check back with him tomorrow night. RT will monitor as needed.

## 2016-10-26 NOTE — H&P (Signed)
History and Physical    [name] QPY:[id number] DOB: 1950-03-22 DOA: 10/26/2016  PCP: [name], MD Consultants:  Stanford Breed - cardiology; Posey Pronto - nephrology; Chucky May - psychiatry; Noemi Chapel - orthopedics Patient coming from:  Home - lives with wife; NOK: wife, [phone]  Chief Complaint: chest pain  HPI: [name] is a 66 y.o. male with medical history significant of OSA not on CPAP; HTN: HLD; CAD c/p 5v CABG; CKD stage 4; DM not currently being treated; gout; and bipolar d/o presenting with chest pain.  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  He had a game last night.  [place] the ballpark [place] and noticed ringing in his ears and then he started breaking out in a cold sweat.  Then his "heart really started hurting".  It has been bothering him lately and he has been seeing Dr. Stanford Breed for what sounds like escalating unstable angina; it has only gotten better with NTG and isosorbide 120 mg daily.   This has mostly been keeping him pain-free until things get really bad late in the day, and then the pain returns most days.  Today, he took 2 NTG 10 minutes apart with no improvement and so he told his producer he needed to go rest - he thought it would improve with laying down.  30 minutes later it was getting worse.  Called 911 and they took him to [facility] in [place], [place].  He has been diagnosed with DM and he has not had diabetes education; he didn't start treating his blood sugar and has not been giving himself injections; he is requesting diabetic education.  He had a stress test in June "and I crashed" - BP dropped to 60/30, HR went very low.  Dr. Stanford Breed said if he ever has non-exertional pain he should call cardiology; this happened today.  He also has Stage IV CKD and wants to ensure that he is well-hydrated prior to any planned cath.  He does report intermittent SOB that happens intermittently.  He  currently has a mild chest pain despite use of nitro-paste.  From cardiology note 06/17/16:  He had a NSTEMI 06/12/09. Cath revealed severe CAD. Echocardiogram showed normal LV function. Patient underwent coronary artery bypass and graft on Jul 25, 2009. He has a liima to the LAD, saphenous vein graft to the diagonal, sequential saphenous vein graft to the second and third marginals and a saphenous vein graft to the acute marginal. His last echo was in 2016 and he had an EF of 55% with moderate LVH, severe LAE, and grade 2 DD. Patient seen recently and noted to have chest pain. He was scheduled for a nuclear study. Prior to the study is electrocardiogram showed sinus rhythm with Mobitz 1 second-degree AV block. Beta blocker DCed. Nuclear study 04/03/2016 showed ejection fraction 50%. There is a small area of mid anterior wall ischemia. Holter monitor January 2018 showed sinus rhythm, first-degree AV block, PACs, PVCs, rare couplet, Mobitz 1 and 2-1 AV block. Since last seen, patient has chest pain with vigorous activities such as climbing stairs, exercise or walking fast. No dyspnea. He has not had presyncope or syncope.  ED Course: Patient had an initial troponin of 0.3.  Repeat prior to transfer was 6.89.  Initial EKG showed inferolateral ST depressions.  Creatinine is stable at 3.1.  Patient was started on heparin drip and transferred at his request.  Review of Systems: As per  HPI; otherwise review of systems reviewed and negative.   Ambulatory Status:  Ambulates without assistance  Past Medical History:  Diagnosis Date  . Back pain    ruptured disc with sciatica  . Benign prostatic hypertrophy   . Bipolar affective disorder, mixed, moderate ([place])   . CAD (coronary artery disease)   . Diabetes mellitus    type II  . Gout   . Homocystinemia ([place])   . Hyperlipidemia   . Hypertension   . Nephrolithiasis   . Obesity   . Pancreatitis   . Renal insufficiency   . Sleep apnea   . 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 ANKLE ARTHROSCOPY    . ANKLE SURGERY     Left  . BASAL CELL CARCINOMA EXCISION  09/26/15  . CORONARY ARTERY BYPASS GRAFT  07/25/2009  . COSMETIC SURGERY     chin  . LIPOSUCTION    . NASAL SEPTUM SURGERY      Social History   Social History  . Marital status: Married    Spouse name: N/A  . Number of children: N/A  . Years of education: N/A   Occupational History  . sportscaster    Social History Main Topics  . Smoking status: Never Smoker  . Smokeless tobacco: Never Used  . Alcohol use 0.0 oz/week     Comment: rare  . Drug use: No  . Sexual activity: Not on file   Other Topics Concern  . Not on file   Social History Narrative  . No narrative on file    Allergies  Allergen Reactions  . Nsaids     Family History  Problem Relation Age of Onset  . Depression Mother   . Cancer Mother        breast  . Diabetes Father   . Heart attack Father   . Diabetes Brother     Prior to Admission medications   Medication Sig Start Date End Date Taking? Authorizing Provider  amLODipine (NORVASC) 10 MG tablet Take 1 tablet (10 mg total) by mouth daily. 06/17/16 09/15/16  Lelon Perla, MD  aspirin 325 MG tablet Take 325 mg by mouth daily. Reported on 09/26/2015    [provider]  BAYER MICROLET LANCETS lancets Check blood sugars once per day. E11.65 04/15/16   Burchette, Alinda Sierras, MD  Blood Glucose Monitoring Suppl (BAYER CONTOUR NEXT MONITOR) w/Device KIT Use as directed. DX: E11.65 04/15/16   Burchette, Alinda Sierras, MD  ciprofloxacin-hydrocortisone (CIPRO [facility] OTIC) otic suspension Place 3 drops into the left ear 2 (two) times daily. 04/11/16   Burchette, Alinda Sierras, MD  clopidogrel (PLAVIX) 75 MG tablet Take 1 tablet (75 mg total) by mouth daily. 04/03/16   Lelon Perla, MD  colchicine 0.6 MG tablet Take 1 tablet (0.6 mg total) by mouth daily. Reported on 09/26/2015 09/26/15   [name], MD  glucose blood (BAYER CONTOUR NEXT TEST) test strip Check blood sugar once per day. DX: E11.65 04/15/16   Burchette, Alinda Sierras, MD  hydrALAZINE (APRESOLINE) 25 MG tablet Take 1 tablet (25 mg total) by mouth 3 (three) times daily. Patient taking differently: Take 50 mg by mouth 3 (three) times daily.  03/20/16   Erlene Quan, PA-C  isosorbide mononitrate (IMDUR) 60 MG 24 hr tablet Take 1 tablet (60 mg total) by mouth 2 (two) times daily as needed. 09/23/16   Lelon Perla, MD  lamoTRIgine (  LAMICTAL) 200 MG tablet Take 1 tablet (200 mg total) by mouth daily. Reported on 09/26/2015 07/15/16   [name], MD  nitroGLYCERIN (NITROSTAT) 0.4 MG SL tablet PLACE 1 TABLET (0.4 MG TOTAL) UNDER THE TONGUE EVERY 5 (FIVE) MINUTES AS NEEDED FOR CHEST PAIN. 07/01/16 09/29/16  Erlene Quan, PA-C  pantoprazole (PROTONIX) 40 MG tablet Take 1 tablet (40 mg total) by mouth daily. 09/26/15   Burchette, Alinda Sierras, MD  rosuvastatin (CRESTOR) 40 MG tablet Take 1 tablet (40 mg total) by mouth daily. 09/25/16   Lelon Perla, MD    Physical Exam: Vitals:   10/26/16 1943 10/26/16 2100 10/26/16 2133 10/26/16 2135  BP: 119/82 (!) 138/106 (!) 127/107   Pulse: 96 (!) 52 (!) 48 94  Resp: 18 19 17  (!) 22  Temp: 97.8 F (36.6 C)     TempSrc: Oral     SpO2: 95% 93% 94% 94%  Weight: 115.1 kg (253 lb 12.8 oz)     Height: 5' 11"  (1.803 m)        General:  Appears calm and comfortable and is NAD; he is animated and loquacious Eyes:  PERRL, EOMI, normal lids, iris ENT:  grossly normal hearing, lips & tongue, mmm; appropriate dentition Neck:  no LAD, masses or thyromegaly; no carotid bruits Cardiovascular:  RRR, no m/r/g. No LE edema.  Respiratory:   CTA bilaterally with no wheezes/rales/rhonchi.  Normal respiratory effort. Abdomen:  soft, NT, ND, NABS Skin:  no rash or induration seen on limited exam Musculoskeletal:  grossly normal tone BUE/BLE, good ROM, no bony abnormality; chronic bony changes of the R  ankle c/w arthritis without apparent gout Lower extremity:  No LE edema.  Limited foot exam with no ulcerations.  2+ distal pulses. Psychiatric:  grossly normal mood and affect, speech fluent and appropriate although possibly a bit pressured, AOx3 Neurologic:  CN 2-12 grossly intact, moves all extremities in coordinated fashion, sensation intact    Radiological Exams on Admission: No results found.  EKG: Independently reviewed.  NSR with Mobitz type 1, rate 74; nonspecific ST changes with no evidence of acute ischemia   Labs on Admission: I have personally reviewed the available labs and imaging studies at the time of the admission.  Pertinent labs:   Glucose 220, 204 A1c 12.0 Lipids 10/03/16: TC 192, LDL 102, HDL 34, TG 281   Assessment/Plan Principal Problem:   NSTEMI (non-ST elevated myocardial infarction) ([place]) Active Problems:   Poorly controlled type 2 diabetes mellitus ([place])   Dyslipidemia   Moderate mixed bipolar I disorder ([name])   Essential hypertension   Sleep apnea   Hypothyroidism   CKD (chronic kidney disease) stage 4, GFR 15-29 ml/min ([name])   NSTEMI -Patient with h/o progressive unstable angina presenting with now non-exertional symptoms; some relief with NTG but not resolution. -CXR unremarkable.   -Initial troponin 0.3 but repeat troponin increased to 6.89, indicating that this is an NSTEMI.   -Initial EKG with apparent inferolateral ST depression of uncertain duration, no apparent STEMI; EKG changes were improved with repeat EKG. -TIMI risk score is 5; which predicts a 14 days risk of death, recurrent MI, or urgent revascularization of 26.2%.  -Patient admitted to [facility]U at [facility] on telemetry (as transfer from [place], [place] hospital) to further evaluate for ACS.  -Patient discussed with Dr. Wynonia Lawman, who plans to see the patient tonight.   -cycle troponin q6h x 3 and repeat EKG in AM -Patient is on ASA 325 mg PO daily as  well as Plavix; will continue for  now -morphine given -Continue Crestor 40 mg qhs for now -Risk factor stratification with FLP not needed due to recently resulted and HgbA1c (12); will also check TSH -He was started on a heparin drip at the OSH; will continue and will also add NTG drip since the patient has ongoing active chest pain -He will need a catheterization but due to his risk of contrast-induced nephropathy in the setting of stage IV CKD, he needs to be aggressively hydrated pre-procedure and also have limited use of dye. -Continue amlodipine. -Continue Imdur for now - although this may be appropriate to hold since patient is on a NTG drip. -Beta blocker was previously discontinued due to bradycardia and h/o second degree AV block; will not start now unless advised to do so by cardiology  DM -Patient acknowledges not taking insulin and reports that this is due to lack of education -Will request diabetes education -For now, cover with resistant-scale SSI -A1c is 12, indicating very poor long-standing control -Improvement in his A1c should also improve the longevity of his renal function  HTN -As above, no beta blocker -Continue Norvasc and hydralazine as well as Imdur  HLD -He is prescribed high-dose Crestor -His 7/18 LDL was 102, well above his goal of <70 -Continue Crestor for now and encourage compliance  CKD -Patient reports having Dr. Posey Pronto as his nephrologist -Consider consultation if needed -He will need aggressive hydration prior to catheterization to try to minimize the risk of contrast-induced nephropathy  OSA -Reports inability to tolerate full face mask CPAP in the past and no recent attempts -Will try autopap with nasal prongs to see if patient is able to tolerate this as an inpatient  Hypothyroidism -Check TSH -Continue Synthroid at current dose for now  Bipolar -Continue Lamictal and Lunesta - patient reports significant success with this regimen recently  DVT prophylaxis: Heparin drip   Code Status:  Full - confirmed with patient Family Communication: None present  Disposition Plan:  Home once clinically improved Consults called: Cardiology, Dr. Wynonia Lawman to see patient tonight; diabetes coordinator  Admission status: Admit - It is my clinical opinion that admission to INPATIENT is reasonable and necessary because this patient will require at least 2 midnights in the hospital to treat this condition based on the medical complexity of the problems presented.  Given the aforementioned information, the predictability of an adverse outcome is felt to be significant.     Karmen Bongo MD [facility]  If note is complete, please contact covering daytime or nighttime physician. [email or web address] Password [facility]  10/26/2016, 9:49 PM

## 2016-10-26 NOTE — Progress Notes (Signed)
Critical troponin *36.6. Dr. Donnie Aho notified. No complaints of cp. VS stable. Pt did report nausea and Zofran was given per orders. Pt's nausea has subsided. Will continue to monitor and assess.

## 2016-10-26 NOTE — Consult Note (Signed)
Cardiology Consult Note  Admit date: 10/26/2016 Name: [name] 66 y.o.  male DOB:  [birth year] MRN:  [id number]  Today's date:  10/26/2016  Referring Physician:   Dr. Karmen Bongo  Primary Physician:    Dr. Carolann Littler  Primary cardiology: Dr. Stanford Breed   Reason for Consultation:    Non-STEMI  IMPRESSIONS: 1.  Non-STEMI may be due to occlusion of the bypass graft or graft disease 2.  Coronary artery disease with previous bypass grafting 3.  Hypertensive heart disease 4.  Stage IV chronic kidney disease 5.  Poorly controlled diabetes with kidney disease 6.  Obesity 7.  History of kidney disease 8.  History of bipolar disease in chart 9.  Hyperlipidemia  RECOMMENDATION: The patient appears to have had a non-STEMI with troponin elevation to 36 but is currently pain-free.  He is at increased risk of contrast-induced nephropathy.  I will begin hydration overnight tonight and we will continue to cycle troponins.  Keep nothing by mouth for possible catheterization tomorrow but will leave final decision to primary team.  Obtain echocardiogram early in the morning.  Continue intravenous heparin and nitroglycerin overnight.  HISTORY: This 66 year old male has a history of poorly controlled diabetes hypertensive heart disease and a prior history of stroke.  He has severe coronary artery disease with bypass grafting in 2011 with grafts to the LAD, saphenous vein graft to the diagonal branch, vein graft sequentially to the OM 2-3 and a vein graft to the acute marginal branch of the right coronary artery.  Following bypass grafting he was able to play tennis up until January of this year.  At that point in time he began to have recurrent exertional angina.  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  He had a myocardial perfusion scan that showed a small focus of anterior ischemia normal ejection fraction.  Because of his severe renal insufficiency he was  treated medically but told to call if he developed rest pain.  He had recently developed worsening frequency and severity of chest pain.  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  This morning he had the onset of severe chest discomfort that lasted around an hour and went back to his hotel room.  He took 2 nitroglycerin with no improvement and when the symptoms did not get better he called EMS.  He  was taken to [facility] in [place].  He was treated with morphine as well as a triglyceride placed on heparin.  Initial troponin was 0.3 and rose to 6.89.  He was pain-free on arrival to [facility] this evening.  Past Medical History:  Diagnosis Date  . Benign prostatic hypertrophy   . Bipolar affective disorder, mixed, moderate ([place])   . CAD (coronary artery disease)   . Diabetes mellitus    type II  . Gout   . Homocystinemia ([place])   . Hyperlipidemia   . Hypertension   . 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 Lumbar disc disease    ruptured disc with sciatica  . Nephrolithiasis   . Obesity   . Pancreatitis   . Sleep apnea   . 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 ANKLE ARTHROSCOPY    . ANKLE SURGERY     Left  . BASAL CELL CARCINOMA EXCISION  09/26/15  .  CORONARY ARTERY BYPASS GRAFT  07/25/2009  . COSMETIC SURGERY     chin  . LIPOSUCTION    . NASAL SEPTUM SURGERY      Allergies:  is allergic to nsaids.   Medications: Prior to Admission medications   Medication Sig Start Date End Date Taking? Authorizing Provider  amLODipine (NORVASC) 10 MG tablet Take 1 tablet (10 mg total) by mouth daily. 06/17/16 09/15/16  Lelon Perla, MD  aspirin 325 MG tablet Take 325 mg by mouth daily. Reported on 09/26/2015    [provider]  BAYER MICROLET LANCETS lancets Check blood sugars once per day. E11.65 04/15/16   Burchette, Alinda Sierras, MD   Blood Glucose Monitoring Suppl (BAYER CONTOUR NEXT MONITOR) w/Device KIT Use as directed. DX: E11.65 04/15/16   [name], MD  clopidogrel (PLAVIX) 75 MG tablet Take 1 tablet (75 mg total) by mouth daily. 04/03/16   Lelon Perla, MD  colchicine 0.6 MG tablet Take 1 tablet (0.6 mg total) by mouth daily. Reported on 09/26/2015 09/26/15   [name], MD  glucose blood (BAYER CONTOUR NEXT TEST) test strip Check blood sugar once per day. DX: E11.65 04/15/16   Burchette, Alinda Sierras, MD  hydrALAZINE (APRESOLINE) 25 MG tablet Take 1 tablet (25 mg total) by mouth 3 (three) times daily. Patient taking differently: Take 50 mg by mouth 3 (three) times daily.  03/20/16   Erlene Quan, PA-C  isosorbide mononitrate (IMDUR) 60 MG 24 hr tablet Take 1 tablet (60 mg total) by mouth 2 (two) times daily as needed. 09/23/16   Lelon Perla, MD  lamoTRIgine (LAMICTAL) 200 MG tablet Take 1 tablet (200 mg total) by mouth daily. Reported on 09/26/2015 07/15/16   [name], MD  nitroGLYCERIN (NITROSTAT) 0.4 MG SL tablet PLACE 1 TABLET (0.4 MG TOTAL) UNDER THE TONGUE EVERY 5 (FIVE) MINUTES AS NEEDED FOR CHEST PAIN. 07/01/16 09/29/16  Erlene Quan, PA-C  pantoprazole (PROTONIX) 40 MG tablet Take 1 tablet (40 mg total) by mouth daily. 09/26/15   Burchette, Alinda Sierras, MD  rosuvastatin (CRESTOR) 40 MG tablet Take 1 tablet (40 mg total) by mouth daily. 09/25/16   Lelon Perla, MD    Family History: Family Status  Relation Status  . Mother Alive  . Father Deceased at age 65  . Sister Alive  . Brother Alive    Social History:   reports that he has never smoked. He has never used smokeless tobacco. He reports that he drinks alcohol. He reports that he does not use drugs.   Social History   Social History Narrative   Currently married, is a Architectural technologist    Review of Systems: He has been obese for many years.  Diabetes is been poorly controlled.  He has been an active due to angina and has not  been able to play tennis.  Does have a history of chronic back pain.  He has a history of second-degree AV block.  Other than as noted above the remainder of the review of systems is unremarkable.  Physical Exam: BP (!) 133/99   Pulse 99   Temp 97.8 F (36.6 C) (Oral)   Resp (!) 22   Ht 5' 11"  (1.803 m)   Wt 115.1 kg (253 lb 12.8 oz)   SpO2 95%   BMI 35.40 kg/m   General appearance: He is a mildly animated obese white male in no acute distress currently not complaining of chest pain Head: Normocephalic, without obvious  abnormality, atraumatic, Balding male hair pattern Eyes: conjunctivae/corneas clear. PERRL, EOM's intact. Fundi not examined  Neck: no adenopathy, no carotid bruit, no JVD and supple, symmetrical, trachea midline Lungs: clear to auscultation bilaterally Heart: regular rate and rhythm, S1, S2 normal, no murmur, click, rub or gallop Abdomen: soft, non-tender; bowel sounds normal; no masses,  no organomegaly Rectal: deferred Extremities: extremities normal, atraumatic, no cyanosis or edema and Healed saphenous vein harvesting scar on right lower extremity Pulses: 2+ and symmetric Skin: Skin color, texture, turgor normal. No rashes or lesions Neurologic: Grossly normal Psych: Alert and oriented x 3 Labs: CBC No results for input(s): WBC, RBC, HGB, HCT, PLT, MCV, MCH, MCHC, RDW, LYMPHSABS, MONOABS, EOSABS, BASOSABS in the last 72 hours.  Invalid input(s): NEUTRABS CMP   Recent Labs  10/26/16 2059  NA 135  K 4.2  CL 102  CO2 22  GLUCOSE 213*  BUN 38*  CREATININE 3.29*  CALCIUM 8.8*  GFRNONAA 19*  GFRAA 21*   Cardiac Panel (last 3 results)  Recent Labs  10/26/16 2059  TROPONINI 36.64*     Radiology:  Not available for review  EKG: Sinus rhythm with second-degree Mobitz 1 block, right bundle branch block, nonspecific changes laterally some improvement in ST depression from previous EKG Independently reviewed by me  Signed:  W. Doristine Church  MD [facility]   Cardiology Consultant  10/26/2016, 11:52 PM

## 2016-10-27 DIAGNOSIS — E1165 Type 2 diabetes mellitus with hyperglycemia: Secondary | ICD-10-CM

## 2016-10-27 DIAGNOSIS — E785 Hyperlipidemia, unspecified: Secondary | ICD-10-CM

## 2016-10-27 DIAGNOSIS — E1122 Type 2 diabetes mellitus with diabetic chronic kidney disease: Secondary | ICD-10-CM

## 2016-10-27 DIAGNOSIS — E038 Other specified hypothyroidism: Secondary | ICD-10-CM

## 2016-10-27 DIAGNOSIS — I214 Non-ST elevation (NSTEMI) myocardial infarction: Principal | ICD-10-CM

## 2016-10-27 DIAGNOSIS — I119 Hypertensive heart disease without heart failure: Secondary | ICD-10-CM

## 2016-10-27 LAB — COMPREHENSIVE METABOLIC PANEL
ALT: 48 U/L (ref 17–63)
AST: 140 U/L — AB (ref 15–41)
Albumin: 3.6 g/dL (ref 3.5–5.0)
Alkaline Phosphatase: 101 U/L (ref 38–126)
Anion gap: 14 (ref 5–15)
BUN: 38 mg/dL — AB (ref 6–20)
CHLORIDE: 104 mmol/L (ref 101–111)
CO2: 19 mmol/L — ABNORMAL LOW (ref 22–32)
Calcium: 8.9 mg/dL (ref 8.9–10.3)
Creatinine, Ser: 3.05 mg/dL — ABNORMAL HIGH (ref 0.61–1.24)
GFR calc Af Amer: 24 mL/min — ABNORMAL LOW (ref >60)
GFR, EST NON AFRICAN AMERICAN: 20 mL/min — AB (ref >60)
Glucose, Bld: 216 mg/dL — ABNORMAL HIGH (ref 65–99)
POTASSIUM: 4 mmol/L (ref 3.5–5.1)
Sodium: 137 mmol/L (ref 135–145)
Total Bilirubin: 1.3 mg/dL — ABNORMAL HIGH (ref 0.3–1.2)
Total Protein: 6.8 g/dL (ref 6.5–8.1)

## 2016-10-27 LAB — TROPONIN I
TROPONIN I: 36.91 ng/mL — AB (ref <0.03)
Troponin I: 33.1 ng/mL (ref <0.03)

## 2016-10-27 LAB — BASIC METABOLIC PANEL
Anion gap: 11 (ref 5–15)
BUN: 36 mg/dL — AB (ref 6–20)
CALCIUM: 8.5 mg/dL — AB (ref 8.9–10.3)
CO2: 21 mmol/L — ABNORMAL LOW (ref 22–32)
CREATININE: 3.06 mg/dL — AB (ref 0.61–1.24)
Chloride: 106 mmol/L (ref 101–111)
GFR, EST AFRICAN AMERICAN: 23 mL/min — AB (ref >60)
GFR, EST NON AFRICAN AMERICAN: 20 mL/min — AB (ref >60)
Glucose, Bld: 212 mg/dL — ABNORMAL HIGH (ref 65–99)
Potassium: 4.4 mmol/L (ref 3.5–5.1)
SODIUM: 138 mmol/L (ref 135–145)

## 2016-10-27 LAB — PROTIME-INR
INR: 0.98
PROTHROMBIN TIME: 13 s (ref 11.4–15.2)

## 2016-10-27 LAB — CBC
HCT: 45.7 % (ref 39.0–52.0)
Hemoglobin: 15 g/dL (ref 13.0–17.0)
MCH: 27.2 pg (ref 26.0–34.0)
MCHC: 32.8 g/dL (ref 30.0–36.0)
MCV: 82.9 fL (ref 78.0–100.0)
PLATELETS: 228 10*3/uL (ref 150–400)
RBC: 5.51 MIL/uL (ref 4.22–5.81)
RDW: 13.7 % (ref 11.5–15.5)
WBC: 22.2 10*3/uL — AB (ref 4.0–10.5)

## 2016-10-27 LAB — GLUCOSE, CAPILLARY
GLUCOSE-CAPILLARY: 203 mg/dL — AB (ref 65–99)
GLUCOSE-CAPILLARY: 197 mg/dL — AB (ref 65–99)
GLUCOSE-CAPILLARY: 207 mg/dL — AB (ref 65–99)
GLUCOSE-CAPILLARY: 240 mg/dL — AB (ref 65–99)
Glucose-Capillary: 216 mg/dL — ABNORMAL HIGH (ref 65–99)

## 2016-10-27 LAB — HIV ANTIBODY (ROUTINE TESTING W REFLEX): HIV SCREEN 4TH GENERATION: NONREACTIVE

## 2016-10-27 LAB — HEPARIN LEVEL (UNFRACTIONATED)
Heparin Unfractionated: 0.2 IU/mL — ABNORMAL LOW (ref 0.30–0.70)
Heparin Unfractionated: 0.58 IU/mL (ref 0.30–0.70)

## 2016-10-27 MED ORDER — PROMETHAZINE HCL 25 MG/ML IJ SOLN
12.5000 mg | Freq: Four times a day (QID) | INTRAMUSCULAR | Status: DC | PRN
  Administered 2016-10-27: 12.5 mg via INTRAVENOUS
  Filled 2016-10-27: qty 1

## 2016-10-27 MED ORDER — INSULIN STARTER KIT- PEN NEEDLES (ENGLISH)
1.0000 | Freq: Once | Status: AC
  Administered 2016-10-27: 1
  Filled 2016-10-27: qty 1

## 2016-10-27 MED ORDER — PREDNISONE 10 MG PO TABS
10.0000 mg | ORAL_TABLET | Freq: Once | ORAL | Status: AC
  Administered 2016-10-27: 10 mg via ORAL
  Filled 2016-10-27: qty 1

## 2016-10-27 MED ORDER — INSULIN DETEMIR 100 UNIT/ML ~~LOC~~ SOLN
15.0000 [IU] | Freq: Every day | SUBCUTANEOUS | Status: DC
  Administered 2016-10-27: 15 [IU] via SUBCUTANEOUS
  Filled 2016-10-27 (×2): qty 0.15

## 2016-10-27 MED ORDER — ASPIRIN EC 81 MG PO TBEC
81.0000 mg | DELAYED_RELEASE_TABLET | Freq: Every day | ORAL | Status: DC
  Administered 2016-10-27 – 2016-10-30 (×4): 81 mg via ORAL
  Filled 2016-10-27 (×4): qty 1

## 2016-10-27 MED ORDER — INSULIN ASPART 100 UNIT/ML ~~LOC~~ SOLN
4.0000 [IU] | Freq: Three times a day (TID) | SUBCUTANEOUS | Status: DC
  Administered 2016-10-27: 4 [IU] via SUBCUTANEOUS

## 2016-10-27 MED ORDER — INSULIN ASPART 100 UNIT/ML ~~LOC~~ SOLN
0.0000 [IU] | Freq: Three times a day (TID) | SUBCUTANEOUS | Status: DC
  Administered 2016-10-27 (×2): 3 [IU] via SUBCUTANEOUS
  Administered 2016-10-28 (×2): 2 [IU] via SUBCUTANEOUS
  Administered 2016-10-28: 1 [IU] via SUBCUTANEOUS
  Administered 2016-10-29: 2 [IU] via SUBCUTANEOUS

## 2016-10-27 MED ORDER — LIVING WELL WITH DIABETES BOOK
Freq: Once | Status: AC
  Administered 2016-10-27: 09:00:00
  Filled 2016-10-27: qty 1

## 2016-10-27 MED ORDER — ALLOPURINOL 100 MG PO TABS
100.0000 mg | ORAL_TABLET | Freq: Every day | ORAL | Status: DC
  Administered 2016-10-27 – 2016-10-30 (×4): 100 mg via ORAL
  Filled 2016-10-27 (×4): qty 1

## 2016-10-27 MED ORDER — SODIUM CHLORIDE 0.9 % IV SOLN
INTRAVENOUS | Status: DC
Start: 2016-10-27 — End: 2016-10-28
  Administered 2016-10-27: 17:00:00 via INTRAVENOUS

## 2016-10-27 MED ORDER — MECLIZINE HCL 25 MG PO TABS
12.5000 mg | ORAL_TABLET | Freq: Once | ORAL | Status: DC | PRN
  Filled 2016-10-27: qty 1

## 2016-10-27 MED ORDER — ALBUTEROL SULFATE (2.5 MG/3ML) 0.083% IN NEBU
2.5000 mg | INHALATION_SOLUTION | RESPIRATORY_TRACT | Status: DC | PRN
  Administered 2016-10-27 – 2016-10-28 (×2): 2.5 mg via RESPIRATORY_TRACT
  Filled 2016-10-27 (×2): qty 3

## 2016-10-27 NOTE — Progress Notes (Addendum)
Spoke with patient about his diabetes.  States that he was diagnosed when he had his CABG in 2011. Was not taking any medication at that time.  Was given insulin to take by his PCP about 9 months ago.  States that it has been in the refrigerator since then. Did not understand how to take the insulin, stated that he was not instructed on how, so did not take it. Having frequent urination and thirst over the last several months. Has not been checking blood sugars at home.  Was given a meter in the box and never removed it to use.   Discussed his HgbA1C of 12%, given information on what his blood sugars had been running with high A1C. Received Living Well with Diabetes booklet, reviewed high and low blood sugars symptoms.  Reviewed the insulin pen and procedure. Teach back was successful.  Recommend patient to give own injections and learn to check blood sugars. Insulin pen starter kit has been ordered from pharmacy.  Encouraged him to ask questions. Interested in outpatient DM education.  Recommend patient to [facility].   Will continue to monitor blood sugars while in the hospital.   Harvel Ricks RN BSN CDE Diabetes Coordinator Pager: [phone]  8am-5pm

## 2016-10-27 NOTE — Progress Notes (Signed)
ANTICOAGULATION CONSULT NOTE - Follow Up Consult  Pharmacy Consult for Heparin  Indication: NSTEMI  Allergies  Allergen Reactions  . Nsaids     Patient Measurements: Height: 5\' 11"  (180.3 cm) Weight: 253 lb 12.8 oz (115.1 kg) IBW/kg (Calculated) : 75.3  Vital Signs: Temp: 97.8 F (36.6 C) (08/19 1943) Temp Source: Oral (08/19 1943) BP: 134/108 (08/20 0205) Pulse Rate: 94 (08/20 0205)  Labs:  Recent Labs  10/26/16 2059 10/27/16 0240  HEPARINUNFRC  --  0.20*  CREATININE 3.29*  --   TROPONINI 36.64*  --     Estimated Creatinine Clearance: 29.6 mL/min (A) (by C-G formula based on SCr of 3.29 mg/dL (H)).  Assessment: 66 y/o M continues on heparin for NSTEMI, possible cath today, troponin at 36, heparin level low this AM, no issues per RN.   Goal of Therapy:  Heparin level 0.3-0.7 units/ml Monitor platelets by anticoagulation protocol: Yes   Plan:  -Inc heparin to 1600 units/hr -1200 HL  Mylin Hirano 10/27/2016,3:33 AM

## 2016-10-27 NOTE — Plan of Care (Signed)
Problem: Food- and Nutrition-Related Knowledge Deficit (NB-1.1) Goal: Nutrition education Formal process to instruct or train a patient/client in a skill or to impart knowledge to help patients/clients voluntarily manage or modify food choices and eating behavior to maintain or improve health. Outcome: Adequate for Discharge  RD consulted for nutrition education regarding diabetes.   Lab Results  Component Value Date   HGBA1C 12.0 (H) 10/26/2016   Case discussed with RN, who reports both pt and wife are receptive to education. Pt has been diagnosed with DM previously, however, was never taken medications (as confirmed by pharmacy).   Spoke with pt, who reports he is adjusting well to administering insulin. Pt reports he has a good understanding of DM diet principles, as he followed a low carb diet previously and lost 35#. 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 He consumes a lot of green vegetables and low calorie beverages. Most of the discussion was spent discussing options for lean protein, complex carbohydrates, low calorie beverages, and portion control.   RD provided "Carbohydrate Counting for People with Diabetes" handout from the Academy of [employer]etics. Discussed different food groups and their effects on blood sugar, emphasizing carbohydrate-containing foods. Provided list of carbohydrates and recommended serving sizes of common foods.  Discussed importance of controlled and consistent carbohydrate intake throughout the day. Provided examples of ways to balance meals/snacks and encouraged intake of high-fiber, whole grain complex carbohydrates. Teach back method used.  Expect fair to good compliance.  Body mass index is 35.4 kg/m. Pt meets criteria for obesity, class II based on current BMI.  Current diet order is Heart Healthy, patient is consuming approximately n/a% of meals at this time. Labs and  medications reviewed. No further nutrition interventions warranted at this time. RD contact information provided. If additional nutrition issues arise, please re-consult RD.  Rodnisha Blomgren A. Mayford Knife, RD, LDN, CDE Pager: [phone] After hours Pager: [phone]

## 2016-10-27 NOTE — Progress Notes (Addendum)
-  Pt complaining of nausea and dizziness.Denies cp. Pt stated hx of sensitivity to nitro in the past. 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 VS obtained BP elevated 138/109, pulse 91, O2 94% on 2L [place], Afebrile. Cardiology paged and notified.   -Cards instructed to contact Hospitalist and not to discontinue the nitro. Hospitalist contacted and order for phenergan and meclizine obtained for the pt's nausea and dizziness. Pt refused meclizine. No further complaints of dizziness or nausea at this time. Will continue to monitor and assess pt.

## 2016-10-27 NOTE — Progress Notes (Signed)
Pt refused cpap. Advised to call if needed anything.  

## 2016-10-27 NOTE — Progress Notes (Signed)
ANTICOAGULATION CONSULT NOTE - Follow Up Consult  Pharmacy Consult for Heparin  Indication: NSTEMI  Allergies  Allergen Reactions  . Nsaids Other (See Comments)    Cannot take because of kidneys    Patient Measurements: Height: 5\' 11"  (180.3 cm) Weight: 253 lb 12.8 oz (115.1 kg) IBW/kg (Calculated) : 75.3  Vital Signs: Temp: 98.1 F (36.7 C) (08/20 0755) Temp Source: Oral (08/20 0755) BP: 129/99 (08/20 0755) Pulse Rate: 91 (08/20 0645)  Labs:  Recent Labs  10/26/16 2059 10/27/16 0240 10/27/16 0807 10/27/16 1144  HGB  --   --   --  15.0  HCT  --   --   --  45.7  PLT  --   --   --  228  LABPROT  --   --  13.0  --   INR  --   --  0.98  --   HEPARINUNFRC  --  0.20*  --  0.58  CREATININE 3.29*  --  3.06* 3.05*  TROPONINI 36.64* 33.10* 36.91*  --     Estimated Creatinine Clearance: 32 mL/min (A) (by C-G formula based on SCr of 3.05 mg/dL (H)).  Assessment: 66 y/o M continues on heparin for NSTEMI, possible cath today, troponin at 36. Heparin level now therapeutic at 0.58. CBC wnl. No bleed documented. Noted renal dysfunction. Likely will need cath per Cardiology.  Goal of Therapy:  Heparin level 0.3-0.7 units/ml Monitor platelets by anticoagulation protocol: Yes   Plan:  -Continue heparin at 1600 units/hr -Daily heparin level/CBC -Monitor for s/sx bleeding -Renal consult, likely cath per Cardiology   Babs Bertin, PharmD, BCPS Clinical Pharmacist Rx Phone # for today: [phone] After 3:30PM, please call Main Rx: #[id number] 10/27/2016 1:16 PM

## 2016-10-27 NOTE — Plan of Care (Addendum)
Problem: Tissue Perfusion: Goal: Risk factors for ineffective tissue perfusion will decrease Outcome: Progressing Pt complaining of sob on 5L [place]. O2 currently between 90-92%. Order for albuterol given. O2 now at 94% on 5L . Pt refusing cpap. Pt educated and still refused. No cp over night or other symptoms. Will continue to monitor.

## 2016-10-27 NOTE — Consult Note (Signed)
Reason for Consult:CKD 4 Referring Physician: Dr. Darcey [name] is an 66 y.o. male.  HPI: 66 yr male with HTn >61yrand known DM for <249yr  Hx CAD, CABG 2011, mild impaired EF.  Now with accel angina and pos cardiac enz. Hx of angina over past few months.  Saw Dr. PaPosey [name] eval 09/15/16.  Cr at that time 3.67, now 3.05.  Had been using Indocin also, off now.  Hx Gout, obesity, renal stones in past. Has not been monitoring DM.  Has OSA but does not wear mask.  Denies ongoing ankle edema, but is SOB hs.  No N, V, itching or cramping or ankle edema. Constitutional: as above Eyes: recent eval  Ears, nose, mouth, throat, and face: negative Respiratory: OSA Cardiovascular: as above Gastrointestinal: negative Genitourinary:noct x 3 Integument/breast: negative Musculoskeletal:gout Neurological: negative Endocrine: DM Allergic/Immunologic: NSAIDS   . Primary Nephrologist [name].    Past Medical History:  Diagnosis Date  . Benign prostatic hypertrophy   . Bipolar affective disorder, mixed, moderate ([place]  . CAD (coronary artery disease)   . Diabetes mellitus    type II  . Gout   . 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 Hyperlipidemia   . Hypertension   . 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 Lumbar disc disease    ruptured disc with sciatica  . Nephrolithiasis   . Obesity   . Pancreatitis   . Sleep apnea   . 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 ANKLE ARTHROSCOPY    . ANKLE SURGERY     Left  . BASAL CELL CARCINOMA EXCISION  09/26/15  . CORONARY ARTERY BYPASS GRAFT  07/25/2009  . COSMETIC SURGERY     chin  . LIPOSUCTION    . NASAL SEPTUM SURGERY      Family History  Problem Relation Age of Onset  . Depression Mother   . Cancer Mother        breast  . Diabetes Father   . Heart attack Father   . Diabetes Brother     Social History:  reports that he has never smoked. He has  never used smokeless tobacco. He reports that he drinks alcohol. He reports that he does not use drugs.  Allergies:  Allergies  Allergen Reactions  . Nsaids Other (See Comments)    Cannot take because of kidneys    Medications:  I have reviewed the patient's current medications. Prior to Admission:  Prescriptions Prior to Admission  Medication Sig Dispense Refill Last Dose  . amLODipine (NORVASC) 10 MG tablet Take 1 tablet (10 mg total) by mouth daily. 90 tablet 3 10/26/2016 at Unknown time  . aspirin 325 MG tablet Take 325 mg by mouth daily. Reported on 09/26/2015   10/26/2016 at Unknown time  . clopidogrel (PLAVIX) 75 MG tablet Take 1 tablet (75 mg total) by mouth daily. 90 tablet 3 10/26/2016 at 0800  . Eszopiclone 3 MG TABS Take 3 mg by mouth at bedtime as needed (for sleep).   2 PRN  . hydrALAZINE (APRESOLINE) 25 MG tablet Take 1 tablet (25 mg total) by mouth 3 (three) times daily. (Patient taking differently: Take 50 mg by mouth 3 (three) times daily. ) 90 tablet 5 10/26/2016 at Unknown time  . isosorbide mononitrate (IMDUR) 60 MG 24 hr tablet Take 1 tablet (60 mg total) by mouth 2 (two) times daily as needed. (  Patient taking differently: Take 60 mg by mouth 2 (two) times daily. ) 180 tablet 3 10/26/2016 at Unknown time  . lamoTRIgine (LAMICTAL) 150 MG tablet Take 150 mg by mouth 2 (two) times daily.  11 10/26/2016 at Unknown time  . nitroGLYCERIN (NITROSTAT) 0.4 MG SL tablet PLACE 1 TABLET (0.4 MG TOTAL) UNDER THE TONGUE EVERY 5 (FIVE) MINUTES AS NEEDED FOR CHEST PAIN. 25 tablet 2 10/26/2016 at Unknown time  . pantoprazole (PROTONIX) 40 MG tablet Take 1 tablet (40 mg total) by mouth daily. 90 tablet 3 10/26/2016 at Unknown time  . rosuvastatin (CRESTOR) 40 MG tablet Take 1 tablet (40 mg total) by mouth daily. 90 tablet 3 10/26/2016 at Unknown time  . BAYER MICROLET LANCETS lancets Check blood sugars once per day. E11.65 (Patient not taking: Reported on 10/27/2016) 100 each 2 Not Taking at Unknown  time  . Blood Glucose Monitoring Suppl (BAYER CONTOUR NEXT MONITOR) w/Device KIT Use as directed. DX: E11.65 (Patient not taking: Reported on 10/27/2016) 1 kit 0 Not Taking at Unknown time  . colchicine 0.6 MG tablet Take 1 tablet (0.6 mg total) by mouth daily. Reported on 09/26/2015 (Patient not taking: Reported on 10/27/2016) 90 tablet 3 Not Taking at Unknown time  . glucose blood (BAYER CONTOUR NEXT TEST) test strip Check blood sugar once per day. DX: E11.65 (Patient not taking: Reported on 10/27/2016) 100 each 2 Not Taking at Unknown time  . [DISCONTINUED] ciprofloxacin-hydrocortisone (CIPRO HC OTIC) otic suspension Place 3 drops into the left ear 2 (two) times daily. 10 mL 1 Taking     Results for orders placed or performed during the hospital encounter of 10/26/16 (from the past 48 hour(s))  Glucose, capillary     Status: Abnormal   Collection Time: 10/26/16  7:34 PM  Result Value Ref Range   Glucose-Capillary 220 (H) 65 - 99 mg/dL  Hemoglobin A1c     Status: Abnormal   Collection Time: 10/26/16  8:59 PM  Result Value Ref Range   Hgb A1c MFr Bld 12.0 (H) 4.8 - 5.6 %    Comment: (NOTE) Pre diabetes:          5.7%-6.4% Diabetes:              >6.4% Glycemic control for   <7.0% adults with diabetes    Mean Plasma Glucose 297.7 mg/dL  HIV antibody (Routine Testing)     Status: None   Collection Time: 10/26/16  8:59 PM  Result Value Ref Range   HIV Screen 4th Generation wRfx Non Reactive Non Reactive    Comment: (NOTE) Performed At: [facility] [place], [place] [id number] Devin Romp MD GB:[id number]   TSH     Status: Abnormal   Collection Time: 10/26/16  8:59 PM  Result Value Ref Range   TSH 6.298 (H) 0.350 - 4.500 uIU/mL    Comment: Performed by a 3rd Generation assay with a functional sensitivity of <=0.01 uIU/mL.  Troponin I     Status: Abnormal   Collection Time: 10/26/16  8:59 PM  Result Value Ref Range   Troponin I 36.64 (HH) <0.03 ng/mL     Comment: CRITICAL RESULT CALLED TO, READ BACK BY AND VERIFIED WITH: [facility] B,RN 10/26/16 2214 WAYK   Basic metabolic panel     Status: Abnormal   Collection Time: 10/26/16  8:59 PM  Result Value Ref Range   Sodium 135 135 - 145 mmol/L   Potassium 4.2 3.5 - 5.1 mmol/L   Chloride  102 101 - 111 mmol/L   CO2 22 22 - 32 mmol/L   Glucose, Bld 213 (H) 65 - 99 mg/dL   BUN 38 (H) 6 - 20 mg/dL   Creatinine, Ser 3.29 (H) 0.61 - 1.24 mg/dL   Calcium 8.8 (L) 8.9 - 10.3 mg/dL   GFR calc non Af Amer 19 (L) >60 mL/min   GFR calc Af Amer 21 (L) >60 mL/min    Comment: (NOTE) The eGFR has been calculated using the CKD EPI equation. This calculation has not been validated in all clinical situations. eGFR's persistently <60 mL/min signify possible Chronic Kidney Disease.    Anion gap 11 5 - 15  Glucose, capillary     Status: Abnormal   Collection Time: 10/26/16  9:25 PM  Result Value Ref Range   Glucose-Capillary 204 (H) 65 - 99 mg/dL  Glucose, capillary     Status: Abnormal   Collection Time: 10/27/16  1:08 AM  Result Value Ref Range   Glucose-Capillary 203 (H) 65 - 99 mg/dL  Troponin I     Status: Abnormal   Collection Time: 10/27/16  2:40 AM  Result Value Ref Range   Troponin I 33.10 (HH) <0.03 ng/mL    Comment: CRITICAL VALUE NOTED.  VALUE IS CONSISTENT WITH PREVIOUSLY REPORTED AND CALLED VALUE.  Heparin level (unfractionated)     Status: Abnormal   Collection Time: 10/27/16  2:40 AM  Result Value Ref Range   Heparin Unfractionated 0.20 (L) 0.30 - 0.70 IU/mL    Comment:        IF HEPARIN RESULTS ARE BELOW EXPECTED VALUES, AND PATIENT DOSAGE HAS BEEN CONFIRMED, SUGGEST FOLLOW UP TESTING OF ANTITHROMBIN III LEVELS.   Glucose, capillary     Status: Abnormal   Collection Time: 10/27/16  7:51 AM  Result Value Ref Range   Glucose-Capillary 197 (H) 65 - 99 mg/dL  Troponin I     Status: Abnormal   Collection Time: 10/27/16  8:07 AM  Result Value Ref Range   Troponin I 36.91 (HH) <0.03  ng/mL    Comment: CRITICAL VALUE NOTED.  VALUE IS CONSISTENT WITH PREVIOUSLY REPORTED AND CALLED VALUE.  Basic metabolic panel     Status: Abnormal   Collection Time: 10/27/16  8:07 AM  Result Value Ref Range   Sodium 138 135 - 145 mmol/L   Potassium 4.4 3.5 - 5.1 mmol/L   Chloride 106 101 - 111 mmol/L   CO2 21 (L) 22 - 32 mmol/L   Glucose, Bld 212 (H) 65 - 99 mg/dL   BUN 36 (H) 6 - 20 mg/dL   Creatinine, Ser 3.06 (H) 0.61 - 1.24 mg/dL   Calcium 8.5 (L) 8.9 - 10.3 mg/dL   GFR calc non Af Amer 20 (L) >60 mL/min   GFR calc Af Amer 23 (L) >60 mL/min    Comment: (NOTE) The eGFR has been calculated using the CKD EPI equation. This calculation has not been validated in all clinical situations. eGFR's persistently <60 mL/min signify possible Chronic Kidney Disease.    Anion gap 11 5 - 15  Protime-INR     Status: None   Collection Time: 10/27/16  8:07 AM  Result Value Ref Range   Prothrombin Time 13.0 11.4 - 15.2 seconds   INR 0.98   Glucose, capillary     Status: Abnormal   Collection Time: 10/27/16 11:33 AM  Result Value Ref Range   Glucose-Capillary 207 (H) 65 - 99 mg/dL  Heparin level (unfractionated)  Status: None   Collection Time: 10/27/16 11:44 AM  Result Value Ref Range   Heparin Unfractionated 0.58 0.30 - 0.70 IU/mL    Comment:        IF HEPARIN RESULTS ARE BELOW EXPECTED VALUES, AND PATIENT DOSAGE HAS BEEN CONFIRMED, SUGGEST FOLLOW UP TESTING OF ANTITHROMBIN III LEVELS.   CBC     Status: Abnormal   Collection Time: 10/27/16 11:44 AM  Result Value Ref Range   WBC 22.2 (H) 4.0 - 10.5 K/uL   RBC 5.51 4.22 - 5.81 MIL/uL   Hemoglobin 15.0 13.0 - 17.0 g/dL   HCT 45.7 39.0 - 52.0 %   MCV 82.9 78.0 - 100.0 fL   MCH 27.2 26.0 - 34.0 pg   MCHC 32.8 30.0 - 36.0 g/dL   RDW 13.7 11.5 - 15.5 %   Platelets 228 150 - 400 K/uL  Comprehensive metabolic panel     Status: Abnormal   Collection Time: 10/27/16 11:44 AM  Result Value Ref Range   Sodium 137 135 - 145 mmol/L    Potassium 4.0 3.5 - 5.1 mmol/L   Chloride 104 101 - 111 mmol/L   CO2 19 (L) 22 - 32 mmol/L   Glucose, Bld 216 (H) 65 - 99 mg/dL   BUN 38 (H) 6 - 20 mg/dL   Creatinine, Ser 3.05 (H) 0.61 - 1.24 mg/dL   Calcium 8.9 8.9 - 10.3 mg/dL   Total Protein 6.8 6.5 - 8.1 g/dL   Albumin 3.6 3.5 - 5.0 g/dL   AST 140 (H) 15 - 41 U/L   ALT 48 17 - 63 U/L   Alkaline Phosphatase 101 38 - 126 U/L   Total Bilirubin 1.3 (H) 0.3 - 1.2 mg/dL   GFR calc non Af Amer 20 (L) >60 mL/min   GFR calc Af Amer 24 (L) >60 mL/min    Comment: (NOTE) The eGFR has been calculated using the CKD EPI equation. This calculation has not been validated in all clinical situations. eGFR's persistently <60 mL/min signify possible Chronic Kidney Disease.    Anion gap 14 5 - 15    No results found.  ROS Blood pressure 119/83, pulse (!) 103, temperature 98.7 F (37.1 C), temperature source Oral, resp. rate 14, height 5' 11"  (1.803 m), weight 115.1 kg (253 lb 12.8 oz), SpO2 91 %. Physical Exam Physical Examination: General appearance - alert, well appearing, and in no distress and overweight Mental status - alert, oriented to person, place, and time Eyes - pupils equal and reactive, extraocular eye movements intact, funduscopic exam normal, discs flat and sharp Mouth - mucous membranes moist, pharynx normal without lesions Neck - adenopathy noted PCL Lymphatics - posterior cervical nodes Chest - rales noted bibasilar Heart - S1 and S2 normal, S4 present, systolic murmur FX9/0 at 2nd left intercostal space Abdomen - obese, pos bs, liver down 6 cm Extremities - pedal edema 1 +, DP 1+ Skin - normal coloration and turgor, no rashes, no suspicious skin lesions noted  Assessment/Plan: 1 CKD 4 discussed  Risks of cath and dye and gave 1-5% or needing HD and 15-20% chance of injury.  Fluids to help limit, he agrees.  Understands long term risk of ESRD at baseline 2 CAD primary risk to mortality for him and emphasized.  Needs  better self care 3 Hypertension: on meds, limited use beta blockers with Heart block 4. Anemia  Not an issue 5. Metabolic Bone Disease: check PTH 6 DM needs ed and control 7 Obesity 8 ^  lipids 9 Gout needs Allopurinol P HD, check PTH, start Allopurinol, ivf.    [name] L 10/27/2016, 4:13 PM

## 2016-10-27 NOTE — Progress Notes (Signed)
Pt able to return demonstrate as well as verbalize how to give self injections of insulin. Pt given self insulin x2 without difficulty.

## 2016-10-27 NOTE — Progress Notes (Signed)
Progress Note  Patient Name: [name] Date of Encounter: 10/27/2016  Primary Cardiologist: Stanford Breed  Subjective   No further chest pain, breathing has improved.   Inpatient Medications    Scheduled Meds: . amLODipine  10 mg Oral Daily  . aspirin  325 mg Oral Daily  . clopidogrel  75 mg Oral Daily  . colchicine  0.6 mg Oral Daily  . hydrALAZINE  50 mg Oral TID  . insulin aspart  0-20 Units Subcutaneous TID WC  . insulin aspart  0-5 Units Subcutaneous QHS  . insulin starter kit- pen needles  1 kit Other Once  . lamoTRIgine  200 mg Oral Daily  . living well with diabetes book   Does not apply Once  . pantoprazole  40 mg Oral Daily  . rosuvastatin  40 mg Oral Daily  . sodium chloride flush  3 mL Intravenous Q12H   Continuous Infusions: . sodium chloride    . sodium chloride 125 mL/hr at 10/26/16 2244  . heparin 1,600 Units/hr (10/27/16 0344)  . nitroGLYCERIN 5 mcg/min (10/26/16 2103)   PRN Meds: sodium chloride, acetaminophen, isosorbide mononitrate, meclizine, ondansetron (ZOFRAN) IV, promethazine, sodium chloride flush, zolpidem   Vital Signs    Vitals:   10/27/16 0448 10/27/16 0611 10/27/16 0645 10/27/16 0755  BP:  (!) 123/95 (!) 125/102 (!) 129/99  Pulse: 88 96 91   Resp: _0 Temp:    98.1 F (36.7 C)  TempSrc:    Oral  SpO2: 94% 93% 93%   Weight:      Height:        Intake/Output Summary (Last 24 hours) at 10/27/16 0998 Last data filed at 10/27/16 0600  Gross per 24 hour  Intake                0 ml  Output             1200 ml  Net            -1200 ml   Filed Weights   10/26/16 1943  Weight: 253 lb 12.8 oz (115.1 kg)    Telemetry    SR 1st degree HB- Personally Reviewed  ECG    SR 1st degree HB - Personally Reviewed  Physical Exam   General: Well developed, well nourished, male appearing in no acute distress. Head: Normocephalic, atraumatic.  Neck: Supple without bruits, JVD. Lungs:  Resp regular and unlabored,  CTA. Heart: RRR, S1, S2, no S3, S4, or murmur; no rub. Abdomen: Soft, non-tender, non-distended with normoactive bowel sounds. No hepatomegaly. No rebound/guarding. No obvious abdominal masses. Extremities: No clubbing, cyanosis, edema. Distal pedal pulses are 2+ bilaterally. Neuro: Alert and oriented X 3. Moves all extremities spontaneously. Psych: Normal affect.  Labs    Chemistry Recent Labs Lab 10/26/16 2059  NA 135  K 4.2  CL 102  CO2 22  GLUCOSE 213*  BUN 38*  CREATININE 3.29*  CALCIUM 8.8*  GFRNONAA 19*  GFRAA 21*  ANIONGAP 11     HematologyNo results for input(s): WBC, RBC, HGB, HCT, MCV, MCH, MCHC, RDW, PLT in the last 168 hours.  Cardiac Enzymes Recent Labs Lab 10/26/16 2059 10/27/16 0240  TROPONINI 36.64* 33.10*   No results for input(s): TROPIPOC in the last 168 hours.   BNPNo results for input(s): BNP, PROBNP in the last 168 hours.   DDimer No results for input(s): DDIMER in the last 168 hours.    Radiology    No results  found.  Cardiac Studies     Patient Profile     66 y.o. male with PMH of CAD s/p CABG (grafts to the LAD, saphenous vein graft to the diagonal branch, vein graft sequentially to the OM 2-3 and a vein graft to the acute marginal branch of the right coronary artery), HTN, HL, DM, CKD IV, and obesity who presented with chest pain and positive troponin.   Assessment & Plan    1. NSTEMI: Presented with chest pain that was unrelieved by SL nitro at home. Trop peaked at 36.64. He was placed on IV nitro and heparin, now pain free. He was recently seen in the office by Dr. Stanford Breed back in 4/18 and the option of cath was discussed at that time but high concern contrast induced nephropathy. Cr baseline appears around 2.7-3.0, now 3.29 on admission. He was made NPO for cath today, but options discussed with the patient regarding cath, and medical management due to risk for worsening renal function. Patient seems to be leaning towards medical  management at this time. Will have MD following up regarding plan.  -- obtain updated echo to assess EF and WMA -- continue IV heparin, ASA, plavix, statin  2. CKD IV: Known renal disease. States he has been seen by Dr. Posey Pronto as an outpatient. Per his report, further interventions were not recommend at this time by nephrologist.  Baseline Cr appears around 2.7-3.0. Was 3.2 on admission. May need to consider nephrology input this admission.  -- noted to be daily colchicine 0.36m prior to admission. Consider reducing, or stopping given renal disease.    3. HTN: Diastolic remains elevated. Currently on norvasc 14mdaily, and hydralazine 5084mID. No BB given HB, no ACEi/ARB 2/2 CKD.  4. HL: LDL 102 (7/18), on Crestor 49m49mily.  5. DM: Hgb A1c 12.0 this admission. Reports he was dx, unsure when, and given a meter/insulin but never started medications.  -- Diabetes coordinator following   Signed, LindReino Bellis  10/27/2016, 8:33 AM  Pager # [phone] seen, examined. Available data reviewed. Agree with findings, assessment, and plan as outlined by LindReino Bellis. Exam reveals a pleasant obese male in no distress. JVP is difficult to visualize but appears normal. Lung fields are clear. Heart is regular rate and rhythm with no murmur or gallop. Abdomen is soft, obese, nontender. Extremities show no edema.  Data is reviewed. This patient is at very high risk with history of CABG, now with non-STEMI and troponin >30. He had 45 minutes of chest pain and diaphoresis yesterday but symptoms resolved with nitroglycerin. He has been having chest discomfort with low-level exertion but yesterday developed severe resting pain.  He has not had recurrent chest discomfort at rest in the hospital on IV nitroglycerin and IV heparin. While his chronic kidney disease is clearly a concern and there is risk of further renal deterioration with contrast administration, I think cardiac catheterization is  clearly indicated in this high-risk patient. We will ask for a formal nephrology consultation prior to proceeding with cardiac catheterization. Anticipate cath plus/minus PCI tomorrow as long as nephrology is in agreement. I have reviewed the risks, indications, and alternatives to cardiac catheterization, possible angioplasty, and stenting with the patient. Risks include but are not limited to bleeding, infection, vascular injury, stroke, myocardial infection, arrhythmia, kidney injury, radiation-related injury in the case of prolonged fluoroscopy use, emergency cardiac surgery, and death. The patient understands the risks of serious complication is 1-2 in [id number]  diagnostic cardiac cath and 1-2% or less with angioplasty/stenting. The patient and his wife are counseled specifically about his increased risk in the setting of diabetes and chronic kidney disease. His labs are reviewed and his creatinine has not changed much over the last 3 years but remains elevated around 3 mg/dL. The patient's medical program is reviewed and includes dual antiplatelet therapy with aspirin and Plavix, hydralazine and amlodipine to control blood pressure, and a high intensity statin drug with Crestor 40 mg daily.  Sherren Mocha, M.D. 10/27/2016 11:38 AM

## 2016-10-27 NOTE — Progress Notes (Signed)
Inpatient Diabetes Program Recommendations  AACE/ADA: New Consensus Statement on Inpatient Glycemic Control (2015)  Target Ranges:  Prepandial:   less than 140 mg/dL      Peak postprandial:   less than 180 mg/dL (1-2 hours)      Critically ill patients:  140 - 180 mg/dL   Lab Results  Component Value Date   GLUCAP 203 (H) 10/27/2016   HGBA1C 12.0 (H) 10/26/2016    Review of Glycemic Control Results for STALEY, LUNZ (MRN [id number]) as of 10/27/2016 07:38  Ref. Range 10/26/2016 19:34 10/26/2016 21:25 10/27/2016 01:08  Glucose-Capillary Latest Ref Range: 65 - 99 mg/dL 220 (H) 204 (H) 203 (H)   Diabetes history: DM2 Outpatient Diabetes medications: None Current orders for Inpatient glycemic control: Novolog correction 0-20 units tid + 0-5 units hs  Inpatient Diabetes Program Recommendations:   Requested Living Well With Diabetes, starter kit with pen needles, and patient education videos. Will plan to speak to patient. Please consider: -Levemir 12 units (0.1 units/kg x 115.1 kg) -Decrease Novolog correction to sensitive q 4 hrs.  Nurses, please start insulin teaching with patient and allow patient to start giving own injections when appropriate. Will follow.  Thank you, Nani [name]. Chenelle Benning, RN, MSN, CDE  Diabetes Coordinator Inpatient Glycemic Control Team Team Pager [phone] (8am-5pm) 10/27/2016 8:18 AM

## 2016-10-27 NOTE — Plan of Care (Addendum)
Problem: Safety: Goal: Ability to remain free from injury will improve Outcome: Progressing Fall risk bundle in place per policy. No falls, skin breakdown or other injuries this shift.   Problem: Pain Managment: Goal: General experience of comfort will improve Outcome: Progressing No complaints of chest pain this shift. Nausea and dizziness have subsided. Will continue to monitor this shift.

## 2016-10-27 NOTE — Progress Notes (Signed)
PROGRESS NOTE  [name]  KTG:[id number]  DOB: Jun 24, 1950  DOA: 10/26/2016 PCP: [name], MD   Brief Admission Hx: [name] is a 66 y.o. male with medical history significant of OSA not on CPAP; HTN: HLD; CAD c/p 5v CABG; CKD stage 4; DM not currently being treated; gout; and bipolar d/o presenting with chest pain and NSTEMI.   MDM/Assessment & Plan:   NSTEMI  - appreciate assistance from cardiology team, IV NTG and Heparin infusion, Planning echo this morning and possible cath. Troponin 33.  Defer medical management to cardiology service.   Type 2 DM with vascular complications uncontrolled as evidenced by A1c of 12%.  Will ask diabetes coordinator for intensive inpatient education, will need to discharge on insulin.  Apparently patient does not take insulin at home.  I'd like to initiate basal bolus supplemental coverage when he is eating.  Currently is NPO.  CBG (last 3)   Recent Labs  10/26/16 2125 10/27/16 0108 10/27/16 0751  GLUCAP 204* 203* 197*   Essential Hypertension  - defer to cardiology service for now.   Diabetic Dyslipidemia - Continue crestor, but the key is getting diabetes under better control and the lipids will follow.   CKD stage 4 - Follow closely.  Will check CMP this morning and daily.  He is followed by Dr. Posey Pronto.  DC colchicine.   Metabolic Syndrome X - treating lipids, blood sugar, blood pressure, counseled on diet and education, consult dietitian.    DVT prophylaxis: heparin Code Status: full  Family Communication:  Disposition Plan: TBD  Consultants:  Cardiology  Subjective: Pt denies chest pain and shortness of breath.   Objective: Vitals:   10/27/16 0448 10/27/16 0611 10/27/16 0645 10/27/16 0755  BP:  (!) 123/95 (!) 125/102 (!) 129/99  Pulse: 88 96 91   Resp: _0 Temp:    98.1 F (36.7 C)  TempSrc:    Oral  SpO2: 94% 93% 93%   Weight:      Height:        Intake/Output Summary (Last 24 hours)  at 10/27/16 0826 Last data filed at 10/27/16 0600  Gross per 24 hour  Intake                0 ml  Output             1200 ml  Net            -1200 ml   Filed Weights   10/26/16 1943  Weight: 115.1 kg (253 lb 12.8 oz)   REVIEW OF SYSTEMS  As per history otherwise all reviewed and reported negative  Exam:  General exam: awake, alert, NAD, cooperative.  Respiratory system: Clear. No increased work of breathing. Cardiovascular system: S1 & S2 heard, RRR. No JVD, murmurs, gallops, clicks or pedal edema. Gastrointestinal system: Gravid Abdomen is nondistended, soft and nontender. Normal bowel sounds heard. No HSM.  Central nervous system: Alert and oriented. No focal neurological deficits. Extremities: no CCE.  Data Reviewed: Basic Metabolic Panel:  Recent Labs Lab 10/26/16 2059  NA 135  K 4.2  CL 102  CO2 22  GLUCOSE 213*  BUN 38*  CREATININE 3.29*  CALCIUM 8.8*   Liver Function Tests: No results for input(s): AST, ALT, ALKPHOS, BILITOT, PROT, ALBUMIN in the last 168 hours. No results for input(s): LIPASE, AMYLASE in the last 168 hours. No results for input(s): AMMONIA in the last 168 hours. CBC: No results for  input(s): WBC, NEUTROABS, HGB, HCT, MCV, PLT in the last 168 hours. Cardiac Enzymes:  Recent Labs Lab 10/26/16 2059 10/27/16 0240  TROPONINI 36.64* 33.10*   CBG (last 3)   Recent Labs  10/26/16 2125 10/27/16 0108 10/27/16 0751  GLUCAP 204* 203* 197*   No results found for this or any previous visit (from the past 240 hour(s)).   Studies: No results found.  Scheduled Meds: . amLODipine  10 mg Oral Daily  . aspirin  325 mg Oral Daily  . clopidogrel  75 mg Oral Daily  . colchicine  0.6 mg Oral Daily  . hydrALAZINE  50 mg Oral TID  . insulin aspart  0-20 Units Subcutaneous TID WC  . insulin aspart  0-5 Units Subcutaneous QHS  . insulin starter kit- pen needles  1 kit Other Once  . lamoTRIgine  200 mg Oral Daily  . living well with diabetes  book   Does not apply Once  . pantoprazole  40 mg Oral Daily  . rosuvastatin  40 mg Oral Daily  . sodium chloride flush  3 mL Intravenous Q12H   Continuous Infusions: . sodium chloride    . sodium chloride 125 mL/hr at 10/26/16 2244  . heparin 1,600 Units/hr (10/27/16 0344)  . nitroGLYCERIN 5 mcg/min (10/26/16 2103)    Principal Problem:   NSTEMI (non-ST elevated myocardial infarction) ([name]) Active Problems:   Type 2 diabetes mellitus, uncontrolled, with renal complications ([name])   Dyslipidemia   Moderate mixed bipolar I disorder ([name])   Hypertensive heart disease without CHF   Sleep apnea   Hypothyroidism   CKD (chronic kidney disease) stage 4, GFR 15-29 ml/min ([name])   Kidney disease, chronic, stage IV (severe, EGFR 15-29 ml/min) ([name])  Time spent:   [name], MD, FAAFP [facility] Pager [phone]  If 7PM-7AM, please contact night-coverage [email or web address] Password [email or web address] 10/27/2016, 8:26 AM    LOS: 1 day

## 2016-10-28 ENCOUNTER — Inpatient Hospital Stay (HOSPITAL_COMMUNITY)
Admission: AD | Disposition: A | Discharge status: Home / Self Care | Payer: Self-pay | Source: Other Acute Inpatient Hospital | Attending: Family Medicine

## 2016-10-28 ENCOUNTER — Inpatient Hospital Stay (HOSPITAL_COMMUNITY): Payer: BLUE CROSS/BLUE SHIELD

## 2016-10-28 ENCOUNTER — Encounter (HOSPITAL_COMMUNITY): Payer: Self-pay | Admitting: Interventional Cardiology

## 2016-10-28 DIAGNOSIS — I10 Essential (primary) hypertension: Secondary | ICD-10-CM

## 2016-10-28 DIAGNOSIS — I2510 Atherosclerotic heart disease of native coronary artery without angina pectoris: Secondary | ICD-10-CM

## 2016-10-28 DIAGNOSIS — N184 Chronic kidney disease, stage 4 (severe): Secondary | ICD-10-CM

## 2016-10-28 DIAGNOSIS — F3162 Bipolar disorder, current episode mixed, moderate: Secondary | ICD-10-CM

## 2016-10-28 HISTORY — PX: LEFT HEART CATH AND CORS/GRAFTS ANGIOGRAPHY: CATH118250

## 2016-10-28 LAB — CBC
HEMATOCRIT: 41.2 % (ref 39.0–52.0)
HEMATOCRIT: 43.8 % (ref 39.0–52.0)
HEMOGLOBIN: 13.3 g/dL (ref 13.0–17.0)
Hemoglobin: 13.4 g/dL (ref 13.0–17.0)
MCH: 27.1 pg (ref 26.0–34.0)
MCH: 26.3 pg (ref 26.0–34.0)
MCHC: 32.3 g/dL (ref 30.0–36.0)
MCHC: 30.6 g/dL (ref 30.0–36.0)
MCV: 83.9 fL (ref 78.0–100.0)
MCV: 86.1 fL (ref 78.0–100.0)
PLATELETS: 215 10*3/uL (ref 150–400)
Platelets: 220 10*3/uL (ref 150–400)
RBC: 4.91 MIL/uL (ref 4.22–5.81)
RBC: 5.09 MIL/uL (ref 4.22–5.81)
RDW: 13.8 % (ref 11.5–15.5)
RDW: 14.3 % (ref 11.5–15.5)
WBC: 17.4 10*3/uL — AB (ref 4.0–10.5)
WBC: 13.2 10*3/uL — ABNORMAL HIGH (ref 4.0–10.5)

## 2016-10-28 LAB — BASIC METABOLIC PANEL
Anion gap: 8 (ref 5–15)
BUN: 38 mg/dL — ABNORMAL HIGH (ref 6–20)
CALCIUM: 8.2 mg/dL — AB (ref 8.9–10.3)
CHLORIDE: 109 mmol/L (ref 101–111)
CO2: 20 mmol/L — ABNORMAL LOW (ref 22–32)
Creatinine, Ser: 3.11 mg/dL — ABNORMAL HIGH (ref 0.61–1.24)
GFR, EST AFRICAN AMERICAN: 23 mL/min — AB (ref >60)
GFR, EST NON AFRICAN AMERICAN: 20 mL/min — AB (ref >60)
Glucose, Bld: 204 mg/dL — ABNORMAL HIGH (ref 65–99)
Potassium: 4.2 mmol/L (ref 3.5–5.1)
SODIUM: 137 mmol/L (ref 135–145)

## 2016-10-28 LAB — GLUCOSE, CAPILLARY
GLUCOSE-CAPILLARY: 230 mg/dL — AB (ref 65–99)
GLUCOSE-CAPILLARY: 181 mg/dL — AB (ref 65–99)
GLUCOSE-CAPILLARY: 196 mg/dL — AB (ref 65–99)
Glucose-Capillary: 133 mg/dL — ABNORMAL HIGH (ref 65–99)

## 2016-10-28 LAB — CREATININE, SERUM
Creatinine, Ser: 3.13 mg/dL — ABNORMAL HIGH (ref 0.61–1.24)
GFR calc Af Amer: 23 mL/min — ABNORMAL LOW (ref >60)
GFR calc non Af Amer: 20 mL/min — ABNORMAL LOW (ref >60)

## 2016-10-28 LAB — POCT ACTIVATED CLOTTING TIME: Activated Clotting Time: 142 seconds

## 2016-10-28 LAB — HEPARIN LEVEL (UNFRACTIONATED): HEPARIN UNFRACTIONATED: 0.38 [IU]/mL (ref 0.30–0.70)

## 2016-10-28 SURGERY — LEFT HEART CATH AND CORS/GRAFTS ANGIOGRAPHY
Anesthesia: LOCAL

## 2016-10-28 MED ORDER — MIDAZOLAM HCL 2 MG/2ML IJ SOLN
INTRAMUSCULAR | Status: AC
  Filled 2016-10-28: qty 2

## 2016-10-28 MED ORDER — HEPARIN SODIUM (PORCINE) 1000 UNIT/ML IJ SOLN
INTRAMUSCULAR | Status: AC
  Filled 2016-10-28: qty 1

## 2016-10-28 MED ORDER — SODIUM CHLORIDE 0.9 % IV SOLN: Freq: Once | INTRAVENOUS | Status: DC

## 2016-10-28 MED ORDER — HEPARIN SODIUM (PORCINE) 1000 UNIT/ML IJ SOLN
INTRAMUSCULAR | Status: DC | PRN
  Administered 2016-10-28: 2000 [IU] via INTRAVENOUS

## 2016-10-28 MED ORDER — SODIUM CHLORIDE 0.9% FLUSH: 3.0000 mL | INTRAVENOUS | Status: DC | PRN

## 2016-10-28 MED ORDER — FUROSEMIDE 10 MG/ML IJ SOLN
160.0000 mg | Freq: Once | INTRAMUSCULAR | Status: AC
  Administered 2016-10-28: 160 mg via INTRAVENOUS
  Filled 2016-10-28: qty 16

## 2016-10-28 MED ORDER — FUROSEMIDE 10 MG/ML IJ SOLN
160.0000 mg | Freq: Once | INTRAVENOUS | Status: AC
  Administered 2016-10-28: 160 mg via INTRAVENOUS
  Filled 2016-10-28: qty 10

## 2016-10-28 MED ORDER — HEPARIN SODIUM (PORCINE) 5000 UNIT/ML IJ SOLN
5000.0000 [IU] | Freq: Three times a day (TID) | INTRAMUSCULAR | Status: DC
  Administered 2016-10-28 – 2016-10-29 (×3): 5000 [IU] via SUBCUTANEOUS
  Filled 2016-10-28 (×4): qty 1

## 2016-10-28 MED ORDER — NITROGLYCERIN 0.4 MG SL SUBL
0.4000 mg | SUBLINGUAL_TABLET | SUBLINGUAL | Status: DC | PRN
  Administered 2016-10-28: 0.4 mg via SUBLINGUAL

## 2016-10-28 MED ORDER — IOPAMIDOL (ISOVUE-370) INJECTION 76%
INTRAVENOUS | Status: AC
  Filled 2016-10-28: qty 125

## 2016-10-28 MED ORDER — ISOSORBIDE MONONITRATE ER 60 MG PO TB24
90.0000 mg | ORAL_TABLET | Freq: Two times a day (BID) | ORAL | Status: DC
  Administered 2016-10-29 – 2016-10-30 (×3): 90 mg via ORAL
  Filled 2016-10-28 (×3): qty 1

## 2016-10-28 MED ORDER — LAMOTRIGINE 150 MG PO TABS
150.0000 mg | ORAL_TABLET | Freq: Two times a day (BID) | ORAL | Status: DC
  Administered 2016-10-28 – 2016-10-30 (×5): 150 mg via ORAL
  Filled 2016-10-28 (×5): qty 1

## 2016-10-28 MED ORDER — ZOLPIDEM TARTRATE 5 MG PO TABS: 5.0000 mg | ORAL_TABLET | Freq: Every evening | ORAL | Status: DC | PRN

## 2016-10-28 MED ORDER — LIDOCAINE HCL 1 % IJ SOLN
INTRAMUSCULAR | Status: AC
  Filled 2016-10-28: qty 20

## 2016-10-28 MED ORDER — LIDOCAINE HCL (PF) 1 % IJ SOLN
INTRAMUSCULAR | Status: DC | PRN
  Administered 2016-10-28: 15 mL

## 2016-10-28 MED ORDER — HEPARIN (PORCINE) IN NACL 2-0.9 UNIT/ML-% IJ SOLN
INTRAMUSCULAR | Status: AC | PRN
  Administered 2016-10-28: 1000 mL

## 2016-10-28 MED ORDER — SODIUM CHLORIDE 0.9% FLUSH: 3.0000 mL | Freq: Two times a day (BID) | INTRAVENOUS | Status: DC

## 2016-10-28 MED ORDER — DIAZEPAM 5 MG PO TABS
10.0000 mg | ORAL_TABLET | Freq: Once | ORAL | Status: DC
  Filled 2016-10-28: qty 2

## 2016-10-28 MED ORDER — NITROGLYCERIN 0.4 MG SL SUBL
SUBLINGUAL_TABLET | SUBLINGUAL | Status: AC
  Filled 2016-10-28: qty 1

## 2016-10-28 MED ORDER — INSULIN ASPART 100 UNIT/ML ~~LOC~~ SOLN
6.0000 [IU] | Freq: Three times a day (TID) | SUBCUTANEOUS | Status: DC
  Administered 2016-10-28 – 2016-10-29 (×4): 6 [IU] via SUBCUTANEOUS

## 2016-10-28 MED ORDER — SODIUM CHLORIDE 0.9 % IV SOLN: 250.0000 mL | INTRAVENOUS | Status: DC | PRN

## 2016-10-28 MED ORDER — INSULIN DETEMIR 100 UNIT/ML ~~LOC~~ SOLN
18.0000 [IU] | Freq: Every day | SUBCUTANEOUS | Status: DC
  Administered 2016-10-29: 18 [IU] via SUBCUTANEOUS
  Filled 2016-10-28 (×3): qty 0.18

## 2016-10-28 MED ORDER — IOPAMIDOL (ISOVUE-370) INJECTION 76%
INTRAVENOUS | Status: DC | PRN
  Administered 2016-10-28: 90 mL via INTRA_ARTERIAL

## 2016-10-28 MED ORDER — ASPIRIN 81 MG PO CHEW: 81.0000 mg | CHEWABLE_TABLET | ORAL | Status: DC

## 2016-10-28 MED ORDER — FENTANYL CITRATE (PF) 100 MCG/2ML IJ SOLN
INTRAMUSCULAR | Status: DC | PRN
  Administered 2016-10-28 (×3): 25 ug via INTRAVENOUS

## 2016-10-28 MED ORDER — FENTANYL CITRATE (PF) 100 MCG/2ML IJ SOLN
INTRAMUSCULAR | Status: AC
  Filled 2016-10-28: qty 2

## 2016-10-28 MED ORDER — HEPARIN (PORCINE) IN NACL 2-0.9 UNIT/ML-% IJ SOLN
INTRAMUSCULAR | Status: AC
  Filled 2016-10-28: qty 1000

## 2016-10-28 MED ORDER — MORPHINE SULFATE (PF) 2 MG/ML IV SOLN: 1.0000 mg | INTRAVENOUS | Status: DC | PRN

## 2016-10-28 MED ORDER — MIDAZOLAM HCL 2 MG/2ML IJ SOLN
INTRAMUSCULAR | Status: DC | PRN
  Administered 2016-10-28: 1 mg via INTRAVENOUS
  Administered 2016-10-28: 2 mg via INTRAVENOUS
  Administered 2016-10-28: 1 mg via INTRAVENOUS

## 2016-10-28 MED ORDER — ACETAMINOPHEN 325 MG PO TABS
650.0000 mg | ORAL_TABLET | ORAL | Status: DC | PRN
  Administered 2016-10-28: 650 mg via ORAL
  Filled 2016-10-28: qty 2

## 2016-10-28 MED ORDER — ONDANSETRON HCL 4 MG/2ML IJ SOLN: 4.0000 mg | Freq: Four times a day (QID) | INTRAMUSCULAR | Status: DC | PRN

## 2016-10-28 MED ORDER — SODIUM CHLORIDE 0.9 % IV SOLN
INTRAVENOUS | Status: DC
  Administered 2016-10-28: 14:00:00 via INTRAVENOUS

## 2016-10-28 SURGICAL SUPPLY — 14 items
CATH INFINITI 5 FR IM (CATHETERS) ×2 IMPLANT
CATH INFINITI 5 FR RCB (CATHETERS) ×2 IMPLANT
CATH INFINITI 5FR AL1 (CATHETERS) ×2 IMPLANT
CATH INFINITI 5FR MULTPACK ANG (CATHETERS) ×2 IMPLANT
COVER PRB 48X5XTLSCP FOLD TPE (BAG) ×1 IMPLANT
COVER PROBE 5X48 (BAG) ×2
HOVERMATT SINGLE USE (MISCELLANEOUS) ×2 IMPLANT
KIT HEART LEFT (KITS) ×2 IMPLANT
PACK CARDIAC CATHETERIZATION (CUSTOM PROCEDURE TRAY) ×2 IMPLANT
PINNACLE LONG 5F 25CM (SHEATH) ×2
SHEATH INTROD PINNACLE 5F 25CM (SHEATH) ×1 IMPLANT
SHEATH PINNACLE 5F 10CM (SHEATH) ×2 IMPLANT
WIRE EMERALD 3MM-J .035X150CM (WIRE) ×2 IMPLANT
WIRE EMERALD 3MM-J .035X260CM (WIRE) ×2 IMPLANT

## 2016-10-28 NOTE — H&P (View-Only) (Signed)
Progress Note  Patient Name: [name] Date of Encounter: 10/27/2016  Primary Cardiologist: Stanford Breed  Subjective   No further chest pain, breathing has improved.   Inpatient Medications    Scheduled Meds: . amLODipine  10 mg Oral Daily  . aspirin  325 mg Oral Daily  . clopidogrel  75 mg Oral Daily  . colchicine  0.6 mg Oral Daily  . hydrALAZINE  50 mg Oral TID  . insulin aspart  0-20 Units Subcutaneous TID WC  . insulin aspart  0-5 Units Subcutaneous QHS  . insulin starter kit- pen needles  1 kit Other Once  . lamoTRIgine  200 mg Oral Daily  . living well with diabetes book   Does not apply Once  . pantoprazole  40 mg Oral Daily  . rosuvastatin  40 mg Oral Daily  . sodium chloride flush  3 mL Intravenous Q12H   Continuous Infusions: . sodium chloride    . sodium chloride 125 mL/hr at 10/26/16 2244  . heparin 1,600 Units/hr (10/27/16 0344)  . nitroGLYCERIN 5 mcg/min (10/26/16 2103)   PRN Meds: sodium chloride, acetaminophen, isosorbide mononitrate, meclizine, ondansetron (ZOFRAN) IV, promethazine, sodium chloride flush, zolpidem   Vital Signs    Vitals:   10/27/16 0448 10/27/16 0611 10/27/16 0645 10/27/16 0755  BP:  (!) 123/95 (!) 125/102 (!) 129/99  Pulse: 88 96 91   Resp: _0 Temp:    98.1 F (36.7 C)  TempSrc:    Oral  SpO2: 94% 93% 93%   Weight:      Height:        Intake/Output Summary (Last 24 hours) at 10/27/16 0998 Last data filed at 10/27/16 0600  Gross per 24 hour  Intake                0 ml  Output             1200 ml  Net            -1200 ml   Filed Weights   10/26/16 1943  Weight: 253 lb 12.8 oz (115.1 kg)    Telemetry    SR 1st degree HB- Personally Reviewed  ECG    SR 1st degree HB - Personally Reviewed  Physical Exam   General: Well developed, well nourished, male appearing in no acute distress. Head: Normocephalic, atraumatic.  Neck: Supple without bruits, JVD. Lungs:  Resp regular and unlabored,  CTA. Heart: RRR, S1, S2, no S3, S4, or murmur; no rub. Abdomen: Soft, non-tender, non-distended with normoactive bowel sounds. No hepatomegaly. No rebound/guarding. No obvious abdominal masses. Extremities: No clubbing, cyanosis, edema. Distal pedal pulses are 2+ bilaterally. Neuro: Alert and oriented X 3. Moves all extremities spontaneously. Psych: Normal affect.  Labs    Chemistry Recent Labs Lab 10/26/16 2059  NA 135  K 4.2  CL 102  CO2 22  GLUCOSE 213*  BUN 38*  CREATININE 3.29*  CALCIUM 8.8*  GFRNONAA 19*  GFRAA 21*  ANIONGAP 11     HematologyNo results for input(s): WBC, RBC, HGB, HCT, MCV, MCH, MCHC, RDW, PLT in the last 168 hours.  Cardiac Enzymes Recent Labs Lab 10/26/16 2059 10/27/16 0240  TROPONINI 36.64* 33.10*   No results for input(s): TROPIPOC in the last 168 hours.   BNPNo results for input(s): BNP, PROBNP in the last 168 hours.   DDimer No results for input(s): DDIMER in the last 168 hours.    Radiology    No results  found.  Cardiac Studies     Patient Profile     66 y.o. male with PMH of CAD s/p CABG (grafts to the LAD, saphenous vein graft to the diagonal branch, vein graft sequentially to the OM 2-3 and a vein graft to the acute marginal branch of the right coronary artery), HTN, HL, DM, CKD IV, and obesity who presented with chest pain and positive troponin.   Assessment & Plan    1. NSTEMI: Presented with chest pain that was unrelieved by SL nitro at home. Trop peaked at 36.64. He was placed on IV nitro and heparin, now pain free. He was recently seen in the office by Dr. Stanford Breed back in 4/18 and the option of cath was discussed at that time but high concern contrast induced nephropathy. Cr baseline appears around 2.7-3.0, now 3.29 on admission. He was made NPO for cath today, but options discussed with the patient regarding cath, and medical management due to risk for worsening renal function. Patient seems to be leaning towards medical  management at this time. Will have MD following up regarding plan.  -- obtain updated echo to assess EF and WMA -- continue IV heparin, ASA, plavix, statin  2. CKD IV: Known renal disease. States he has been seen by Dr. Posey Pronto as an outpatient. Per his report, further interventions were not recommend at this time by nephrologist.  Baseline Cr appears around 2.7-3.0. Was 3.2 on admission. May need to consider nephrology input this admission.  -- noted to be daily colchicine 0.36m prior to admission. Consider reducing, or stopping given renal disease.    3. HTN: Diastolic remains elevated. Currently on norvasc 14mdaily, and hydralazine 5084mID. No BB given HB, no ACEi/ARB 2/2 CKD.  4. HL: LDL 102 (7/18), on Crestor 49m49mily.  5. DM: Hgb A1c 12.0 this admission. Reports he was dx, unsure when, and given a meter/insulin but never started medications.  -- Diabetes coordinator following   Signed, LindReino Bellis  10/27/2016, 8:33 AM  Pager # [phone] seen, examined. Available data reviewed. Agree with findings, assessment, and plan as outlined by LindReino Bellis. Exam reveals a pleasant obese male in no distress. JVP is difficult to visualize but appears normal. Lung fields are clear. Heart is regular rate and rhythm with no murmur or gallop. Abdomen is soft, obese, nontender. Extremities show no edema.  Data is reviewed. This patient is at very high risk with history of CABG, now with non-STEMI and troponin >30. He had 45 minutes of chest pain and diaphoresis yesterday but symptoms resolved with nitroglycerin. He has been having chest discomfort with low-level exertion but yesterday developed severe resting pain.  He has not had recurrent chest discomfort at rest in the hospital on IV nitroglycerin and IV heparin. While his chronic kidney disease is clearly a concern and there is risk of further renal deterioration with contrast administration, I think cardiac catheterization is  clearly indicated in this high-risk patient. We will ask for a formal nephrology consultation prior to proceeding with cardiac catheterization. Anticipate cath plus/minus PCI tomorrow as long as nephrology is in agreement. I have reviewed the risks, indications, and alternatives to cardiac catheterization, possible angioplasty, and stenting with the patient. Risks include but are not limited to bleeding, infection, vascular injury, stroke, myocardial infection, arrhythmia, kidney injury, radiation-related injury in the case of prolonged fluoroscopy use, emergency cardiac surgery, and death. The patient understands the risks of serious complication is 1-2 in [id number]  diagnostic cardiac cath and 1-2% or less with angioplasty/stenting. The patient and his wife are counseled specifically about his increased risk in the setting of diabetes and chronic kidney disease. His labs are reviewed and his creatinine has not changed much over the last 3 years but remains elevated around 3 mg/dL. The patient's medical program is reviewed and includes dual antiplatelet therapy with aspirin and Plavix, hydralazine and amlodipine to control blood pressure, and a high intensity statin drug with Crestor 40 mg daily.  Sherren Mocha, M.D. 10/27/2016 11:38 AM

## 2016-10-28 NOTE — Progress Notes (Signed)
Site area: Right groin a 5 french arterial sheath was removed  Site Prior to Removal:  Level 0  Pressure Applied For 20 MINUTES    Bedrest Beginning at 1155p  Manual:   Yes.    Patient Status During Pull:  stable  Post Pull Groin Site:  Level 0  Post Pull Instructions Given:  Yes.    Post Pull Pulses Present:  Yes.    Dressing Applied:  Yes.    Comments:  VS remain stable during sheath pull.

## 2016-10-28 NOTE — Progress Notes (Signed)
ACT 142

## 2016-10-28 NOTE — Progress Notes (Signed)
Pt. Destated to 80s. Allbuterol given. Pt. Stats now 94, pt states feeling better. Lamcital also given.

## 2016-10-28 NOTE — Progress Notes (Signed)
Subjective: Interval History: has complaints very uncomfortable with CP.  Objective: Vital signs in last 24 hours: Temp:  [97.7 F (36.5 C)-98.6 F (37 C)] 97.7 F (36.5 C) (08/21 0809) Pulse Rate:  [0-105] 88 (08/21 1145) Resp:  [0-61] 12 (08/21 1145) BP: (101-133)/(68-101) 101/68 (08/21 1145) SpO2:  [0 %-99 %] 94 % (08/21 1145) Weight:  [115.4 kg (254 lb 8 oz)] 115.4 kg (254 lb 8 oz) (08/21 0430) Weight change: 0.318 kg (11.2 oz)  Intake/Output from previous day: 08/20 0701 - 08/21 0700 In: 1957.1 [P.O.:480; I.V.:1477.1] Out: 1275 [Urine:1275] Intake/Output this shift: No intake/output data recorded.  General appearance: severe distress, pale and dyspneic Resp: rales bilaterally Cardio: S1, S2 normal and systolic murmur: holosystolic 2/6, blowing at apex GI: obese, pos bs but decreased Extremities: edema 1+  Lab Results:  Recent Labs  10/28/16 0503 10/28/16 1400  WBC 17.4* 13.2*  HGB 13.3 13.4  HCT 41.2 43.8  PLT 220 215   BMET:  Recent Labs  10/27/16 1144 10/28/16 0737 10/28/16 1400  NA 137 137  --   K 4.0 4.2  --   CL 104 109  --   CO2 19* 20*  --   GLUCOSE 216* 204*  --   BUN 38* 38*  --   CREATININE 3.05* 3.11* 3.13*  CALCIUM 8.9 8.2*  --    No results for input(s): PTH in the last 72 hours. Iron Studies: No results for input(s): IRON, TIBC, TRANSFERRIN, FERRITIN in the last 72 hours.  Studies/Results: No results found.  I have reviewed the patient's current medications.  Assessment/Plan: 1 Dyspnea, CP will give Lasix, stop ivf and get Cards to see. 2 CKD 4 lasix, get am labs 3 HPTH pending 4 DM  5 Obesity 6 OSA 7 ^ lipids P Lasix, follow Cr, stop ivf    LOS: 2 days   [place] Surges L 10/28/2016,4:04 PM

## 2016-10-28 NOTE — Progress Notes (Signed)
   Discussed patient's concerns of contrast induced nephropathy at length.  He understands the risks of cath and dye exposure.  He is not currently having any chest pain.  He has no bleeding problems.  No elective surgery upcoming.  Already taking Plavix and tolerating this well.   Corky Crafts, MD

## 2016-10-28 NOTE — Progress Notes (Signed)
Progress Note  Patient Name: [name] Date of Encounter: 10/28/2016  Primary Cardiologist: Jens Som  Subjective   AM: No further chest pain since admission, breathing has improved, pt does complain of headache relieved by tylenol, and is apprehensive about the Cath procedure this morning. He was able to talk with nephrology yesterday and feels more reassured about his decision to proceed despite his advanced renal disease.    Post LHC 1527 PM: pt resting comfortably, reports no chest pain, nausea or shortness of breath.  Inpatient Medications    Scheduled Meds: . allopurinol  100 mg Oral Daily  . amLODipine  10 mg Oral Daily  . aspirin EC  81 mg Oral Daily  . clopidogrel  75 mg Oral Daily  . heparin  5,000 Units Subcutaneous Q8H  . hydrALAZINE  50 mg Oral TID  . insulin aspart  0-9 Units Subcutaneous TID WC  . insulin aspart  6 Units Subcutaneous TID WC  . insulin detemir  18 Units Subcutaneous Daily  . lamoTRIgine  150 mg Oral BID  . pantoprazole  40 mg Oral Daily  . rosuvastatin  40 mg Oral Daily  . sodium chloride flush  3 mL Intravenous Q12H  . sodium chloride flush  3 mL Intravenous Q12H   Continuous Infusions: . sodium chloride    . sodium chloride 125 mL/hr at 10/27/16 0841  . sodium chloride 125 mL/hr at 10/27/16 1639  . sodium chloride 100 mL/hr at 10/28/16 1332  . sodium chloride    . nitroGLYCERIN Stopped (10/28/16 1103)   PRN Meds: sodium chloride, sodium chloride, acetaminophen, acetaminophen, albuterol, isosorbide mononitrate, meclizine, ondansetron (ZOFRAN) IV, promethazine, sodium chloride flush, sodium chloride flush, zolpidem   Vital Signs    Vitals:   10/28/16 1108 10/28/16 1115 10/28/16 1130 10/28/16 1145  BP: 106/87 113/87 113/80 101/68  Pulse: 91 87 88 88  Resp: 15 15 11 12   Temp:      TempSrc:      SpO2: (!) 89% 90% 93% 94%  Weight:      Height:        Intake/Output Summary (Last 24 hours) at 10/28/16 1530 Last data filed  at 10/28/16 0700  Gross per 24 hour  Intake          1498.33 ml  Output             1075 ml  Net           423.33 ml   Filed Weights   10/26/16 1943 10/28/16 0430  Weight: 115.1 kg (253 lb 12.8 oz) 115.4 kg (254 lb 8 oz)    Telemetry    SR 1st degree HB- Personally Reviewed  ECG    SR 1st degree HB - Personally Reviewed  Physical Exam   General: Well developed, well nourished, male appearing in no acute distress. Head: Normocephalic, atraumatic.  Neck: Supple without bruits, JVD. Lungs:  Resp regular and unlabored, CTA. Heart: RRR, S1, S2, no S3, S4, or murmur; no rub. Abdomen: Soft, non-tender, non-distended with normoactive bowel sounds. No hepatomegaly. No rebound/guarding. No obvious abdominal masses. Extremities: No clubbing, cyanosis, edema. Distal pedal pulses are 2+ bilaterally. Neuro: Alert and oriented X 3. Moves all extremities spontaneously. Psych: Normal affect.  Labs    Chemistry  Recent Labs Lab 10/27/16 [phone] 10/27/16 1144 10/28/16 0737 10/28/16 1400  NA 138 137 137  --   K 4.4 4.0 4.2  --   CL 106 104 109  --  CO2 21* 19* 20*  --   GLUCOSE 212* 216* 204*  --   BUN 36* 38* 38*  --   CREATININE 3.06* 3.05* 3.11* 3.13*  CALCIUM 8.5* 8.9 8.2*  --   PROT  --  6.8  --   --   ALBUMIN  --  3.6  --   --   AST  --  140*  --   --   ALT  --  48  --   --   ALKPHOS  --  101  --   --   BILITOT  --  1.3*  --   --   GFRNONAA 20* 20* 20* 20*  GFRAA 23* 24* 23* 23*  ANIONGAP 11 14 8   --      Hematology  Recent Labs Lab 10/27/16 1144 10/28/16 0503 10/28/16 1400  WBC 22.2* 17.4* 13.2*  RBC 5.51 4.91 5.09  HGB 15.0 13.3 13.4  HCT 45.7 41.2 43.8  MCV 82.9 83.9 86.1  MCH 27.2 27.1 26.3  MCHC 32.8 32.3 30.6  RDW 13.7 13.8 14.3  PLT 228 220 215    Cardiac Enzymes  Recent Labs Lab 10/26/16 2059 10/27/16 0240 10/27/16 0807  TROPONINI 36.64* 33.10* 36.91*   No results for input(s): TROPIPOC in the last 168 hours.   BNPNo results for  input(s): BNP, PROBNP in the last 168 hours.   DDimer No results for input(s): DDIMER in the last 168 hours.    Radiology    No results found.  Cardiac Studies     Patient Profile     66 y.o. male with PMH of CAD s/p CABG (grafts to the LAD, saphenous vein graft to the diagonal branch, vein graft sequentially to the OM 2-3 and a vein graft to the acute marginal branch of the right coronary artery), HTN, HL, DM, CKD IV, and obesity who presented with chest pain and positive troponin.   Assessment & Plan    1. NSTEMI: Presented with chest pain that was unrelieved by SL nitro at home. Trop peaked at 36.64. He was placed on IV nitro and heparin, now pain free. He was recently seen in the office by Dr. Jens Som back in 4/18 and the option of cath was discussed at that time but high concern contrast induced nephropathy. Cr baseline appears around 2.7-3.0, now 3.29 on admission. He was made NPO for cath today, but options discussed with the patient regarding cath, and medical management due to risk for worsening renal function. After talking with Dr. Excell Seltzer and Dr. Darrick Penna (nephrology) pt opted for the cath with the understanding that it may only be diagnostic but would be critical to his further management and pt agrees that the benefit of catheterization far outweighs the risk.    --Left heart cath performed this AM:   Prox LAD to Mid LAD lesion, 75 %stenosed. SVG to diagonal is patent. Mid LAD lesion, 100 %stenosed. LIMA to LAD is patent. Mid RCA lesion, 99 %stenosed. Distal vessel is heavily diseased. Acute Mrg lesion, 95 %stenosed. SVG to acute marginal is occluded. Prox Cx lesion, 100 %stenosed. SVG to OM2 is patent. Sequential graft from OM2 to OM3 is occluded. LV end diastolic pressure is moderately elevated. There is no aortic valve stenosis.  --no stent placement indicated during this procedure,  lesion in proximal LAD may be interrogated further in the future but for now pt  already given 90cc contrast during diagnostic phase of cath. Risk benefit ratio of stenting this lesion is in favor  of medical management for now and follow up.    -Pt tolerated procedure well and is not in pain at this time.   --ECHO still pending, pt will likely receive tonight   2. CKD IV: Known renal disease. States he has been seen by Dr. Allena Katz as an outpatient. Per his report, further interventions were not recommend at this time by nephrologist.  Baseline Cr appears around 2.7-3.0. Was 3.2 on admission. May need to consider nephrology input this admission.   -- noted to be daily colchicine 0.6mg  prior to admission. Consider reducing, or stopping given renal disease.   -- only 90cc contrast used during heart cath procedure  3. HTN: Diastolic remains elevated. Currently on norvasc 10mg  daily, and hydralazine 50mg  TID. No BB given HB, no ACEi/ARB 2/2 CKD.  4. HL: LDL 102 (7/18), on Crestor 40mg  daily.  5. DM: Hgb A1c 12.0 this admission. Reports he was dx, unsure when, and given a meter/insulin but never started medications.  -- Diabetes coordinator following  Signed, Thornell Mule, MD  10/28/2016, 3:30 PM   Patient seen, examined. Available data reviewed. Agree with findings, assessment, and plan as outlined by Thornell Mule, MD. Since Dr Starr Sinclair evaluation, the patient has developed chest pain and shortness of breath. Now feeling much better after administration of SL NTG. He has also been seen by the renal team and has been administered IV lasix 160 mg x 1.   On my exam, the patient is now comfortable, in NAD. JVP elevated, lungs with bilateral rales, CV RRR no murmur or gallop, abdomen soft, NT, extremities with 1+ edema.   Plan: medical Rx for CAD, agree with IV diuresis, increase imdur to 90 mg BID. Follow renal fxn closely with diuresis. LVEDP elevated at cath and patient clearly needs diuresis.   Tonny Bollman, M.D. 10/28/2016 4:41 PM

## 2016-10-28 NOTE — Interval H&P Note (Signed)
Cath Lab Visit (complete for each Cath Lab visit)  Clinical Evaluation Leading to the Procedure:   ACS: Yes.    Non-ACS:    Anginal Classification: CCS IV  Anti-ischemic medical therapy: Minimal Therapy (1 class of medications)  Non-Invasive Test Results: No non-invasive testing performed  Prior CABG: Previous CABG      History and Physical Interval Note:  10/28/2016 9:43 AM  [name]  has presented today for surgery, with the diagnosis of cp  The various methods of treatment have been discussed with the patient and family. After consideration of risks, benefits and other options for treatment, the patient has consented to  Procedure(s): LEFT HEART CATH AND CORONARY ANGIOGRAPHY (N/A) as a surgical intervention .  The patient's history has been reviewed, patient examined, no change in status, stable for surgery.  I have reviewed the patient's chart and labs.  Questions were answered to the patient's satisfaction.     Lance Muss

## 2016-10-28 NOTE — Progress Notes (Signed)
Pt declined CPAP use 

## 2016-10-28 NOTE — Progress Notes (Signed)
PROGRESS NOTE  [name]  PPI:[id number]  DOB: [birth year]  DOA: 10/26/2016 PCP: [name], MD  Brief Admission Hx: [name] is a 66 y.o. male with medical history significant of OSA not on CPAP; HTN: HLD; CAD c/p 5v CABG; CKD stage 4; DM not currently being treated; gout; and bipolar d/o presenting with chest pain and NSTEMI.   MDM/Assessment & Plan:   NSTEMI  - appreciate assistance from cardiology team, IV NTG and Heparin infusion, Planning echo this morning and possible cath. Troponin 33.  Defer medical management to cardiology service.  Cath is planned for this morning.   Type 2 DM with vascular complications uncontrolled as evidenced by A1c of 12%.  Will ask diabetes coordinator for intensive inpatient education, will need to discharge on insulin.  Apparently patient does not take insulin at home. Continue basal bolus supplemental coverage when he is eating.  He has been giving h is own insulin injections.  Currently is NPO for cath.  Will continue to titrate insulin doses daily until good glycemic control achieved.    CBG (last 3)   Recent Labs  10/27/16 1637 10/27/16 2101 10/28/16 0319  GLUCAP 216* 240* 230*   Essential Hypertension  - stable, controlled.   Diabetic Dyslipidemia - Continue crestor, but the key is getting diabetes under better control and the lipids will follow.   CKD stage 4 - Follow closely.  Following CMP.  Nephrology consult appreciated.   DC colchicine.   Metabolic Syndrome X - treating lipids, blood sugar, blood pressure, counseled on diet and education, consult dietitian.   OSA - he did not tolerate CPAP last night.    Gout - Pt had a bout of gout yesterday treated with prednisone 10 mg.    DVT prophylaxis: heparin Code Status: full  Family Communication:  Disposition Plan: TBD  Consultants:  Cardiology  Subjective: Pt had chest pain and SOB when ambulating to bathroom this morning, pain is 1/10.  It is improving  now that he is back in the bed. He is going for cath today.   Objective: Vitals:   10/27/16 2307 10/28/16 0050 10/28/16 0430 10/28/16 0552  BP:  (!) 128/100 (!) 130/101   Pulse: (!) 103 (!) 103 96 97  Resp: 12 16 (!) 21 14  Temp:  98.3 F (36.8 C) 98.6 F (37 C)   TempSrc:  Axillary Axillary   SpO2: 92% 92% 94% 92%  Weight:   115.4 kg (254 lb 8 oz)   Height:        Intake/Output Summary (Last 24 hours) at 10/28/16 0736 Last data filed at 10/28/16 0700  Gross per 24 hour  Intake          1957.08 ml  Output             1275 ml  Net           682.08 ml   Filed Weights   10/26/16 1943 10/28/16 0430  Weight: 115.1 kg (253 lb 12.8 oz) 115.4 kg (254 lb 8 oz)   REVIEW OF SYSTEMS  As per history otherwise all reviewed and reported negative  Exam:  General exam: awake, alert, NAD, cooperative.  Respiratory system: Clear. No increased work of breathing. Cardiovascular system: S1 & S2 heard, RRR. No JVD, murmurs, gallops, clicks or pedal edema. Gastrointestinal system: Gravid Abdomen is nondistended, soft and nontender. Normal bowel sounds heard. No HSM.  Central nervous system: Alert and oriented. No focal neurological deficits. Extremities: no  CCE.  Data Reviewed: Basic Metabolic Panel:  Recent Labs Lab 10/26/16 2059 10/27/16 0807 10/27/16 1144  NA 135 138 137  K 4.2 4.4 4.0  CL 102 106 104  CO2 22 21* 19*  GLUCOSE 213* 212* 216*  BUN 38* 36* 38*  CREATININE 3.29* 3.06* 3.05*  CALCIUM 8.8* 8.5* 8.9   Liver Function Tests:  Recent Labs Lab 10/27/16 1144  AST 140*  ALT 48  ALKPHOS 101  BILITOT 1.3*  PROT 6.8  ALBUMIN 3.6   No results for input(s): LIPASE, AMYLASE in the last 168 hours. No results for input(s): AMMONIA in the last 168 hours. CBC:  Recent Labs Lab 10/27/16 1144 10/28/16 0503  WBC 22.2* 17.4*  HGB 15.0 13.3  HCT 45.7 41.2  MCV 82.9 83.9  PLT 228 220   Cardiac Enzymes:  Recent Labs Lab 10/26/16 2059 10/27/16 0240 10/27/16 0807   TROPONINI 36.64* 33.10* 36.91*   CBG (last 3)   Recent Labs  10/27/16 1637 10/27/16 2101 10/28/16 0319  GLUCAP 216* 240* 230*   No results found for this or any previous visit (from the past 240 hour(s)).   Studies: No results found.  Scheduled Meds: . allopurinol  100 mg Oral Daily  . amLODipine  10 mg Oral Daily  . aspirin EC  81 mg Oral Daily  . clopidogrel  75 mg Oral Daily  . hydrALAZINE  50 mg Oral TID  . insulin aspart  0-9 Units Subcutaneous TID WC  . insulin aspart  6 Units Subcutaneous TID WC  . insulin detemir  18 Units Subcutaneous Daily  . lamoTRIgine  200 mg Oral Daily  . pantoprazole  40 mg Oral Daily  . rosuvastatin  40 mg Oral Daily  . sodium chloride flush  3 mL Intravenous Q12H   Continuous Infusions: . sodium chloride    . sodium chloride 125 mL/hr at 10/27/16 0841  . sodium chloride 125 mL/hr at 10/27/16 1639  . heparin 1,600 Units/hr (10/27/16 0841)  . nitroGLYCERIN 5 mcg/min (10/26/16 2103)   Principal Problem:   NSTEMI (non-ST elevated myocardial infarction) ([place]) Active Problems:   Type 2 diabetes mellitus, uncontrolled, with renal complications ([name])   Dyslipidemia   Moderate mixed bipolar I disorder ([name])   Hypertensive heart disease without CHF   Sleep apnea   Hypothyroidism   CKD (chronic kidney disease) stage 4, GFR 15-29 ml/min ([name])   Kidney disease, chronic, stage IV (severe, EGFR 15-29 ml/min) ([place])  Time spent:   [name], MD, FAAFP [facility] Pager [phone]  If 7PM-7AM, please contact night-coverage [email or web address] Password [email or web address] 10/28/2016, 7:36 AM    LOS: 2 days

## 2016-10-28 NOTE — Progress Notes (Signed)
Pt decided he wanted to try to wear a CPAP. Set pt up on Auto 20(max)-8(min), with nasal mask. Pt unable to tolerate at this time. RN aware. RT will continue to monitor.

## 2016-10-28 NOTE — Progress Notes (Signed)
Pt. Was complaining of chest pain and SOB. Called MD. Jovita Gamma IMDUR and 1 tablet of nitro. Pt. States he feels a lot better. Wife stated that husband has been taking 2 tablets of nitro tablets daily before his heart attack.

## 2016-10-29 ENCOUNTER — Inpatient Hospital Stay (HOSPITAL_COMMUNITY): Payer: BLUE CROSS/BLUE SHIELD

## 2016-10-29 ENCOUNTER — Other Ambulatory Visit (HOSPITAL_COMMUNITY): Payer: BLUE CROSS/BLUE SHIELD

## 2016-10-29 DIAGNOSIS — I340 Nonrheumatic mitral (valve) insufficiency: Secondary | ICD-10-CM

## 2016-10-29 DIAGNOSIS — G4730 Sleep apnea, unspecified: Secondary | ICD-10-CM

## 2016-10-29 LAB — ECHOCARDIOGRAM COMPLETE
Height: 71 in
Weight: 4072 oz

## 2016-10-29 LAB — CBC
HEMATOCRIT: 40.2 % (ref 39.0–52.0)
Hemoglobin: 12.5 g/dL — ABNORMAL LOW (ref 13.0–17.0)
MCH: 26.2 pg (ref 26.0–34.0)
MCHC: 31.1 g/dL (ref 30.0–36.0)
MCV: 84.3 fL (ref 78.0–100.0)
PLATELETS: 223 10*3/uL (ref 150–400)
RBC: 4.77 MIL/uL (ref 4.22–5.81)
RDW: 14.3 % (ref 11.5–15.5)
WBC: 13.7 10*3/uL — ABNORMAL HIGH (ref 4.0–10.5)

## 2016-10-29 LAB — GLUCOSE, CAPILLARY
GLUCOSE-CAPILLARY: 203 mg/dL — AB (ref 65–99)
GLUCOSE-CAPILLARY: 203 mg/dL — AB (ref 65–99)
GLUCOSE-CAPILLARY: 159 mg/dL — AB (ref 65–99)
GLUCOSE-CAPILLARY: 238 mg/dL — AB (ref 65–99)
Glucose-Capillary: 345 mg/dL — ABNORMAL HIGH (ref 65–99)
Glucose-Capillary: 255 mg/dL — ABNORMAL HIGH (ref 65–99)

## 2016-10-29 LAB — RENAL FUNCTION PANEL
Albumin: 3.1 g/dL — ABNORMAL LOW (ref 3.5–5.0)
Anion gap: 13 (ref 5–15)
BUN: 40 mg/dL — ABNORMAL HIGH (ref 6–20)
CHLORIDE: 104 mmol/L (ref 101–111)
CO2: 19 mmol/L — AB (ref 22–32)
CREATININE: 3.42 mg/dL — AB (ref 0.61–1.24)
Calcium: 8.4 mg/dL — ABNORMAL LOW (ref 8.9–10.3)
GFR calc non Af Amer: 18 mL/min — ABNORMAL LOW (ref >60)
GFR, EST AFRICAN AMERICAN: 20 mL/min — AB (ref >60)
Glucose, Bld: 177 mg/dL — ABNORMAL HIGH (ref 65–99)
POTASSIUM: 3.8 mmol/L (ref 3.5–5.1)
Phosphorus: 4.3 mg/dL (ref 2.5–4.6)
Sodium: 136 mmol/L (ref 135–145)

## 2016-10-29 MED ORDER — INSULIN ASPART 100 UNIT/ML ~~LOC~~ SOLN
6.0000 [IU] | Freq: Three times a day (TID) | SUBCUTANEOUS | Status: DC
  Administered 2016-10-29 – 2016-10-30 (×3): 6 [IU] via SUBCUTANEOUS

## 2016-10-29 MED ORDER — INSULIN ASPART 100 UNIT/ML ~~LOC~~ SOLN
0.0000 [IU] | Freq: Three times a day (TID) | SUBCUTANEOUS | Status: DC
  Administered 2016-10-29: 5 [IU] via SUBCUTANEOUS
  Administered 2016-10-29: 11 [IU] via SUBCUTANEOUS
  Administered 2016-10-30: 3 [IU] via SUBCUTANEOUS
  Administered 2016-10-30: 6 [IU] via SUBCUTANEOUS

## 2016-10-29 MED ORDER — INSULIN ASPART 100 UNIT/ML ~~LOC~~ SOLN
0.0000 [IU] | Freq: Every day | SUBCUTANEOUS | Status: DC
  Administered 2016-10-29: 3 [IU] via SUBCUTANEOUS

## 2016-10-29 MED ORDER — PREDNISONE 20 MG PO TABS
40.0000 mg | ORAL_TABLET | Freq: Every day | ORAL | Status: DC
  Administered 2016-10-29: 40 mg via ORAL
  Filled 2016-10-29: qty 2

## 2016-10-29 MED ORDER — PREDNISONE 20 MG PO TABS
20.0000 mg | ORAL_TABLET | Freq: Every day | ORAL | Status: DC
  Administered 2016-10-30: 20 mg via ORAL
  Filled 2016-10-29: qty 1

## 2016-10-29 MED ORDER — FUROSEMIDE 80 MG PO TABS
80.0000 mg | ORAL_TABLET | Freq: Every day | ORAL | Status: DC
  Administered 2016-10-30: 80 mg via ORAL
  Filled 2016-10-29: qty 1

## 2016-10-29 MED ORDER — HYDROCODONE-ACETAMINOPHEN 5-325 MG PO TABS
1.0000 | ORAL_TABLET | ORAL | Status: DC | PRN
  Administered 2016-10-29: 1 via ORAL
  Filled 2016-10-29: qty 2

## 2016-10-29 MED FILL — Lidocaine HCl Local Inj 1%: INTRAMUSCULAR | Qty: 15 | Status: AC

## 2016-10-29 NOTE — Progress Notes (Signed)
  Echocardiogram 2D Echocardiogram has been performed.  [name] 10/29/2016, 4:41 PM

## 2016-10-29 NOTE — Progress Notes (Signed)
Inpatient Diabetes Program Recommendations  AACE/ADA: New Consensus Statement on Inpatient Glycemic Control (2015)  Target Ranges:  Prepandial:   less than 140 mg/dL      Peak postprandial:   less than 180 mg/dL (1-2 hours)      Critically ill patients:  140 - 180 mg/dL   Lab Results  Component Value Date   GLUCAP 238 (H) 10/29/2016   HGBA1C 12.0 (H) 10/26/2016    Review of Glycemic Control  Post-prandial blood sugars elevated.  Inpatient Diabetes Program Recommendations:    Increase Novolog to 8 units tidwc for meal coverage insulin.  Continue to follow.  Thank you. Ailene Ards, RD, LDN, CDE Inpatient Diabetes Coordinator [phone]

## 2016-10-29 NOTE — Progress Notes (Signed)
Progress Note    [name]  LYY:[id number] DOB: 1950/06/02  DOA: 10/26/2016 PCP: Kristian Covey, MD    Brief Narrative:   Chief complaint: Follow-up non-STEMI  Medical records reviewed and are as summarized below:  [name] is an 66 y.o. male with a PMH of OSA not on CPAP, hypertension, hyperlipidemia, CAD status post 5 vessel CABG, stage IV CK D, diet-controlled diabetes, gout, and bipolar disorder who was admitted 10/26/16 for evaluation of chest pain. He is under the care of Dr. Jens Som at baseline, and has been treated for escalating unstable angina with nitroglycerin and isosorbide. Initial troponin was 0.03 and bumped up to 6.89 on a subsequent reading. He had inferolateral ST depression and was started on heparin drip.  Assessment/Plan:   Principal Problem: NSTEMI  Status post cardiology evaluation with recurrent cardiac catheterization which showed 2/5 bypass grafts patent. 2-D echo recommended with medical management. PCI to RCA into acute marginal "could be considered" per cath report. He is being treated with IV nitroglycerin and heparin. Continue increased dose Imdur at 90 mg BID, aspirin and Plavix.  Active problems: Type 2 DM with vascular complications uncontrolled as evidenced by A1c of 12%.   Diabetes coordinator counseled patient 10/29/16. CBGs [id number]. Change SSI to moderate scale Q AC/HS and continue Levemir 18 units daily and 6 units of meal coverage.  Hypertension Appears to be controlled. Continue current regimen: Lasix, Norvasc, Imdur.  Hyperlipidemia Continue Crestor.  Stage IV CKD Nephrology is following.  OSA He is intolerant of CPAP.  Metabolic X syndrome Continue to control blood pressure, lipids, hyperglycemia.  Gout Improved with 40 mg of prednisone. We'll decrease prednisone to 20 mg for 2 more days. Continue allopurinol.  Obesity  Body mass index is 35.5 kg/m.   Bipolar disorder Continue Lamictal.    Family  Communication/Anticipated D/C date and plan/Code Status   DVT prophylaxis: Lovenox ordered. Code Status: Full Code.  Family Communication: No family at the bedside. Disposition Plan: Home when cleared by cardiology.   Medical Consultants:    Cardiology    Anti-Infectives:    None  Subjective:   Denies chest pain, dyspnea. Has gout pain right foot.  Objective:    Vitals:   10/29/16 0916 10/29/16 1300 10/29/16 1656 10/29/16 1700  BP: 118/88  105/77   Pulse:      Resp:      Temp:  (!) 97.5 F (36.4 C)  97.6 F (36.4 C)  TempSrc:  Axillary  Oral  SpO2:      Weight:      Height:        Intake/Output Summary (Last 24 hours) at 10/29/16 1911 Last data filed at 10/29/16 1700  Gross per 24 hour  Intake             1149 ml  Output             4125 ml  Net            -2976 ml   Filed Weights   10/26/16 1943 10/28/16 0430  Weight: 115.1 kg (253 lb 12.8 oz) 115.4 kg (254 lb 8 oz)    Exam: General: No acute distress. Cardiovascular: Heart sounds show a regular rate, and rhythm. No gallops or rubs. No murmurs. No JVD. Lungs: Clear to auscultation bilaterally with good air movement. No rales, rhonchi or wheezes. Abdomen: Soft, nontender, nondistended with normal active bowel sounds. No masses. No hepatosplenomegaly. Neurological: Alert and oriented 3. Moves  all extremities 4 with equal strength. Cranial nerves II through XII grossly intact. Skin: Warm and dry. No rashes or lesions. Extremities: No clubbing or cyanosis. No edema. Pedal pulses 2+. Right groin cath site OK, no hematoma. Psychiatric: Mood and affect are normal. Insight and judgment are Mildly impaired.   Data Reviewed:   I have personally reviewed following labs and imaging studies:  Labs: Labs show the following: Sodium 136, potassium 3.8, chloride 104, bicarbonate 19, BUN 40, creatinine 2.42, glucose 177. WBC 13.7, hemoglobin 12.5, platelets 223.   Troponin 36.64, 33.10, 36.91.   TSH 6.298.    Procedures and diagnostic studies:  10/29/16: Dg Chest 2 View: My independent review of the image shows: Interstitial edema and bilateral pleural effusions.     Medications:   . allopurinol  100 mg Oral Daily  . amLODipine  10 mg Oral Daily  . aspirin EC  81 mg Oral Daily  . clopidogrel  75 mg Oral Daily  . diazepam  10 mg Oral Once  . furosemide  80 mg Oral Daily  . heparin  5,000 Units Subcutaneous Q8H  . insulin aspart  0-15 Units Subcutaneous TID WC  . insulin aspart  0-5 Units Subcutaneous QHS  . insulin aspart  6 Units Subcutaneous TID WC  . insulin detemir  18 Units Subcutaneous Daily  . isosorbide mononitrate  90 mg Oral BID  . lamoTRIgine  150 mg Oral BID  . pantoprazole  40 mg Oral Daily  . [START ON 10/30/2016] predniSONE  20 mg Oral QAC breakfast  . rosuvastatin  40 mg Oral Daily  . sodium chloride flush  3 mL Intravenous Q12H  . sodium chloride flush  3 mL Intravenous Q12H   Continuous Infusions: . sodium chloride    . sodium chloride    . nitroGLYCERIN Stopped (10/28/16 1103)   Level III visit   LOS: 3 days   RAMA,CHRISTINA  [facility] Pager [phone]. If unable to reach me by pager, please call my cell phone at [phone].  *Please refer to [email or web address], password [email or web address] to get updated schedule on who will round on this patient, as hospitalists switch teams weekly. If 7PM-7AM, please contact night-coverage at [email or web address], password [email or web address] for any overnight needs.  10/29/2016, 7:11 PM

## 2016-10-29 NOTE — Progress Notes (Signed)
Subjective: Interval History: has no complaint, much better breathing and overall.  Objective: Vital signs in last 24 hours: Temp:  [98.1 F (36.7 C)-98.9 F (37.2 C)] 98.9 F (37.2 C) (08/22 0700) Pulse Rate:  [88-108] 108 (08/22 0026) Resp:  [11-18] 18 (08/22 0026) BP: (101-136)/(68-99) 118/88 (08/22 0916) SpO2:  [92 %-95 %] 95 % (08/22 0026) Weight change:   Intake/Output from previous day: 08/21 0701 - 08/22 0700 In: 306 [P.O.:240; IV Piggyback:66] Out: 4100 [Urine:4100] Intake/Output this shift: Total I/O In: 243 [P.O.:240; I.V.:3] Out: 450 [Urine:450]  General appearance: alert, cooperative, no distress, moderately obese and pale Resp: rales bibasilar Cardio: S1, S2 normal and systolic murmur: holosystolic 2/6, blowing at apex GI: obese, pos bs, liver down 5 cm Extremities: edema 1-2+  Lab Results:  Recent Labs  10/28/16 1400 10/29/16 0407  WBC 13.2* 13.7*  HGB 13.4 12.5*  HCT 43.8 40.2  PLT 215 223   BMET:  Recent Labs  10/28/16 0737 10/28/16 1400 10/29/16 0407  NA 137  --  136  K 4.2  --  3.8  CL 109  --  104  CO2 20*  --  19*  GLUCOSE 204*  --  177*  BUN 38*  --  40*  CREATININE 3.11* 3.13* 3.42*  CALCIUM 8.2*  --  8.4*   No results for input(s): PTH in the last 72 hours. Iron Studies: No results for input(s): IRON, TIBC, TRANSFERRIN, FERRITIN in the last 72 hours.  Studies/Results: No results found.  I have reviewed the patient's current medications.  Assessment/Plan: 1 CKD 4 within his range. Cr ^ with diuresis/dye.  Needs chronic diuretic. 2 HPTH 3 CAD needs some definitive procedure prior to TX eval 4 DM needs control 5 Obesity major issue 6 HTN stop hydral, add Lasix P Lasix, antianginal med, ??intervention    LOS: 3 days   Maddock Finigan L 10/29/2016,11:23 AM

## 2016-10-29 NOTE — CV Procedure (Signed)
@  Decho attempted but patient going to Xray, will try later

## 2016-10-29 NOTE — Progress Notes (Signed)
Progress Note  Patient Name: [name] Date of Encounter: 10/29/2016  Primary Cardiologist: Jens Som  Subjective   No chest pain since episode occurring late yesterday afternoon that was relieved by SL nitro.  No shortness of breath, pt reports urinating a lot last night.  Pt refused cpap last night.    Inpatient Medications    Scheduled Meds: . allopurinol  100 mg Oral Daily  . amLODipine  10 mg Oral Daily  . aspirin EC  81 mg Oral Daily  . clopidogrel  75 mg Oral Daily  . diazepam  10 mg Oral Once  . heparin  5,000 Units Subcutaneous Q8H  . hydrALAZINE  50 mg Oral TID  . insulin aspart  0-9 Units Subcutaneous TID WC  . insulin aspart  6 Units Subcutaneous TID WC  . insulin detemir  18 Units Subcutaneous Daily  . isosorbide mononitrate  90 mg Oral BID  . lamoTRIgine  150 mg Oral BID  . pantoprazole  40 mg Oral Daily  . predniSONE  40 mg Oral QAC breakfast  . rosuvastatin  40 mg Oral Daily  . sodium chloride flush  3 mL Intravenous Q12H  . sodium chloride flush  3 mL Intravenous Q12H   Continuous Infusions: . sodium chloride    . sodium chloride    . nitroGLYCERIN Stopped (10/28/16 1103)   PRN Meds: sodium chloride, sodium chloride, acetaminophen, acetaminophen, albuterol, HYDROcodone-acetaminophen, meclizine, morphine injection, nitroGLYCERIN, ondansetron (ZOFRAN) IV, promethazine, sodium chloride flush, sodium chloride flush, zolpidem   Vital Signs    Vitals:   10/29/16 0026 10/29/16 0700 10/29/16 0915 10/29/16 0916  BP: 116/90  118/88 118/88  Pulse: (!) 108     Resp: 18     Temp: 98.1 F (36.7 C) 98.9 F (37.2 C)    TempSrc: Oral Oral    SpO2: 95%     Weight:      Height:        Intake/Output Summary (Last 24 hours) at 10/29/16 0945 Last data filed at 10/29/16 0430  Gross per 24 hour  Intake              306 ml  Output             4100 ml  Net            -3794 ml   Filed Weights   10/26/16 1943 10/28/16 0430  Weight: 115.1 kg (253 lb  12.8 oz) 115.4 kg (254 lb 8 oz)    Telemetry    SR 2nd degree mobitz type 1 - Personally Reviewed  ECG    SR 1st degree HB - Personally Reviewed  Physical Exam   General: Well developed, well nourished, male appearing in no acute distress. Head: Normocephalic, atraumatic.  Neck: Supple without bruits, JVD. Lungs:  Resp regular and unlabored, CTA. Heart: RRR, S1, S2, no S3, S4, or murmur; no rub. Abdomen: Soft, non-tender, non-distended with normoactive bowel sounds. No hepatomegaly. No rebound/guarding. No obvious abdominal masses. Extremities: No clubbing, cyanosis, edema. Distal pedal pulses are 2+ bilaterally. Neuro: Alert and oriented X 3. Moves all extremities spontaneously. Psych: Normal affect.  Labs    Chemistry  Recent Labs Lab 10/27/16 1144 10/28/16 0737 10/28/16 1400 10/29/16 0407  NA 137 137  --  136  K 4.0 4.2  --  3.8  CL 104 109  --  104  CO2 19* 20*  --  19*  GLUCOSE 216* 204*  --  177*  BUN 38*  38*  --  40*  CREATININE 3.05* 3.11* 3.13* 3.42*  CALCIUM 8.9 8.2*  --  8.4*  PROT 6.8  --   --   --   ALBUMIN 3.6  --   --  3.1*  AST 140*  --   --   --   ALT 48  --   --   --   ALKPHOS 101  --   --   --   BILITOT 1.3*  --   --   --   GFRNONAA 20* 20* 20* 18*  GFRAA 24* 23* 23* 20*  ANIONGAP 14 8  --  13     Hematology  Recent Labs Lab 10/28/16 0503 10/28/16 1400 10/29/16 0407  WBC 17.4* 13.2* 13.7*  RBC 4.91 5.09 4.77  HGB 13.3 13.4 12.5*  HCT 41.2 43.8 40.2  MCV 83.9 86.1 84.3  MCH 27.1 26.3 26.2  MCHC 32.3 30.6 31.1  RDW 13.8 14.3 14.3  PLT 220 215 223    Cardiac Enzymes  Recent Labs Lab 10/26/16 2059 10/27/16 0240 10/27/16 0807  TROPONINI 36.64* 33.10* 36.91*   No results for input(s): TROPIPOC in the last 168 hours.   BNPNo results for input(s): BNP, PROBNP in the last 168 hours.   DDimer No results for input(s): DDIMER in the last 168 hours.    Radiology    No results found.  Cardiac Studies     Patient Profile      66 y.o. male with PMH of CAD s/p CABG (grafts to the LAD, saphenous vein graft to the diagonal branch, vein graft sequentially to the OM 2-3 and a vein graft to the acute marginal branch of the right coronary artery), HTN, HL, DM, CKD IV, and obesity who presented with chest pain and positive troponin.   Assessment & Plan    1. NSTEMI: Presented with chest pain that was unrelieved by SL nitro at home. Trop peaked at 36.64. He was placed on IV nitro and heparin, now pain free. He was recently seen in the office by Dr. Jens Som back in 4/18 and the option of cath was discussed at that time but high concern contrast induced nephropathy. Cr baseline appears around 2.7-3.0, now 3.29 on admission. He was made NPO for cath today, but options discussed with the patient regarding cath, and medical management due to risk for worsening renal function. After talking with Dr. Excell Seltzer and Dr. Darrick Penna (nephrology) pt opted for the cath with the understanding that it may only be diagnostic but would be critical to his further management and pt agrees that the benefit of catheterization far outweighs the risk.    --Left heart cath performed 8/21   Prox LAD to Mid LAD lesion, 75 %stenosed. SVG to diagonal is patent. Mid LAD lesion, 100 %stenosed. LIMA to LAD is patent. Mid RCA lesion, 99 %stenosed. Distal vessel is heavily diseased. Acute Mrg lesion, 95 %stenosed. SVG to acute marginal is occluded. Prox Cx lesion, 100 %stenosed. SVG to OM2 is patent. Sequential graft from OM2 to OM3 is occluded. LV end diastolic pressure is moderately elevated. There is no aortic valve stenosis.  --no stent placement indicated during this procedure,  lesion in proximal LAD may be interrogated further in the future but for now pt already given 90cc contrast during diagnostic phase of cath. Risk benefit ratio of stenting this lesion is in favor of medical management for now and follow up.    -Pt tolerated procedure well and is  not in pain at  this time.   --ECHO still pending, important to help evaluate management plan going forward. --IMDUR increased to 90mg  BID  2. CKD IV: Known renal disease. States he has been seen by Dr. Allena Katz as an outpatient. Per his report, further interventions were not recommend at this time by nephrologist.  Baseline Cr appears around 2.7-3.0. Was 3.2 on admission.  --Pt had chest pain episode and shortness of breath yesterday afternoon 10/28/16 around 4pm, had bibasilar rales pt was given 120mg  iv lasix with good diuresis of around 4L and improvement in symptoms. --Creatinine increased to 3.42 on 10/29/16 from 3.13 the day prior --pt staying overnight will check renal function in am --colchicine dced, pt started on allopurinol instead.   -- only 90cc contrast used during heart cath procedure   3. HTN: Diastolic remains elevated. Currently on norvasc 10mg  daily, and hydralazine 50mg  TID. No BB given HB, no ACEi/ARB 2/2 CKD.  4. HL: LDL 102 (7/18), on Crestor 40mg  daily.  5. DM: Hgb A1c 12.0 this admission. Reports he was dx, unsure when, and given a meter/insulin but never started medications.  -- Diabetes coordinator following -- Pt getting more comfortable giving himself insulin  6.OSA: last sleep study four years ago, was supposed to be on CPAP since then but has not been using due to difficulty sleeping wearing it  --Explained increased importance of CPAP given his extensive coronary disease and likely decreased cardiac function. --Pt willing to try wearing cpap and changing to a different type of mask to help him tolerate it.  Signed, Thornell Mule, MD  10/29/2016, 9:45 AM

## 2016-10-30 LAB — CBC
HCT: 35.1 % — ABNORMAL LOW (ref 39.0–52.0)
Hemoglobin: 11.2 g/dL — ABNORMAL LOW (ref 13.0–17.0)
MCH: 26.5 pg (ref 26.0–34.0)
MCHC: 31.9 g/dL (ref 30.0–36.0)
MCV: 83 fL (ref 78.0–100.0)
PLATELETS: 223 10*3/uL (ref 150–400)
RBC: 4.23 MIL/uL (ref 4.22–5.81)
RDW: 14 % (ref 11.5–15.5)
WBC: 10.5 10*3/uL (ref 4.0–10.5)

## 2016-10-30 LAB — RENAL FUNCTION PANEL
ALBUMIN: 2.9 g/dL — AB (ref 3.5–5.0)
Anion gap: 11 (ref 5–15)
BUN: 58 mg/dL — AB (ref 6–20)
CHLORIDE: 102 mmol/L (ref 101–111)
CO2: 22 mmol/L (ref 22–32)
CREATININE: 3.99 mg/dL — AB (ref 0.61–1.24)
Calcium: 8.6 mg/dL — ABNORMAL LOW (ref 8.9–10.3)
GFR calc Af Amer: 17 mL/min — ABNORMAL LOW (ref >60)
GFR, EST NON AFRICAN AMERICAN: 15 mL/min — AB (ref >60)
Glucose, Bld: 246 mg/dL — ABNORMAL HIGH (ref 65–99)
Phosphorus: 4.8 mg/dL — ABNORMAL HIGH (ref 2.5–4.6)
Potassium: 3.8 mmol/L (ref 3.5–5.1)
SODIUM: 135 mmol/L (ref 135–145)

## 2016-10-30 LAB — GLUCOSE, CAPILLARY
GLUCOSE-CAPILLARY: 212 mg/dL — AB (ref 65–99)
Glucose-Capillary: 188 mg/dL — ABNORMAL HIGH (ref 65–99)

## 2016-10-30 MED ORDER — METOPROLOL TARTRATE 12.5 MG HALF TABLET: 12.5000 mg | ORAL_TABLET | Freq: Two times a day (BID) | ORAL | Status: DC

## 2016-10-30 MED ORDER — ALLOPURINOL 100 MG PO TABS: 100.0000 mg | ORAL_TABLET | Freq: Every day | ORAL | 2 refills | Status: AC

## 2016-10-30 MED ORDER — ISOSORBIDE MONONITRATE ER 30 MG PO TB24: 90.0000 mg | ORAL_TABLET | Freq: Two times a day (BID) | ORAL | 2 refills | Status: AC

## 2016-10-30 MED ORDER — INSULIN ASPART 100 UNIT/ML FLEXPEN: 8.0000 [IU] | PEN_INJECTOR | Freq: Three times a day (TID) | SUBCUTANEOUS | 11 refills | Status: AC

## 2016-10-30 MED ORDER — AMLODIPINE BESYLATE 5 MG PO TABS: 5.0000 mg | ORAL_TABLET | Freq: Every day | ORAL | Status: DC

## 2016-10-30 MED ORDER — INSULIN ASPART 100 UNIT/ML ~~LOC~~ SOLN
8.0000 [IU] | Freq: Three times a day (TID) | SUBCUTANEOUS | Status: DC
  Administered 2016-10-30: 8 [IU] via SUBCUTANEOUS

## 2016-10-30 MED ORDER — INSULIN GLARGINE 100 UNIT/ML SOLOSTAR PEN: 18.0000 [IU] | PEN_INJECTOR | Freq: Every day | SUBCUTANEOUS | 11 refills | Status: AC

## 2016-10-30 MED ORDER — FUROSEMIDE 80 MG PO TABS: 80.0000 mg | ORAL_TABLET | Freq: Every day | ORAL | 2 refills | Status: AC

## 2016-10-30 MED ORDER — ASPIRIN 81 MG PO TABS: 81.0000 mg | ORAL_TABLET | Freq: Every day | ORAL | Status: AC

## 2016-10-30 MED ORDER — AMLODIPINE BESYLATE 10 MG PO TABS: 10.0000 mg | ORAL_TABLET | Freq: Every day | ORAL | Status: DC

## 2016-10-30 NOTE — Plan of Care (Signed)
Problem: Health Behavior/Discharge Planning: Goal: Ability to manage health-related needs will improve Outcome: Completed/Met Date Met: 10/30/16 Patient being discharged home per MD order.   Problem: Pain Managment: Goal: General experience of comfort will improve Outcome: Completed/Met Date Met: 10/30/16 Patient pain free.   Problem: Skin Integrity: Goal: Risk for impaired skin integrity will decrease Outcome: Completed/Met Date Met: 10/30/16 Patient has no signs or symptoms of infection.

## 2016-10-30 NOTE — Discharge Instructions (Signed)
Heart Failure °Heart failure means your heart has trouble pumping blood. This makes it hard for your body to work well. Heart failure is usually a long-term (chronic) condition. You must take good care of yourself and follow your doctor's treatment plan. °Follow these instructions at home: °· Take your heart medicine as told by your doctor. °? Do not stop taking medicine unless your doctor tells you to. °? Do not skip any dose of medicine. °? Refill your medicines before they run out. °? Take other medicines only as told by your doctor or pharmacist. °· Stay active if told by your doctor. The elderly and people with severe heart failure should talk with a doctor about physical activity. °· Eat heart-healthy foods. Choose foods that are without trans fat and are low in saturated fat, cholesterol, and salt (sodium). This includes fresh or frozen fruits and vegetables, fish, lean meats, fat-free or low-fat dairy foods, whole grains, and high-fiber foods. Lentils and dried peas and beans (legumes) are also good choices. °· Limit salt if told by your doctor. °· Cook in a healthy way. Roast, grill, broil, bake, poach, steam, or stir-fry foods. °· Limit fluids as told by your doctor. °· Weigh yourself every morning. Do this after you pee (urinate) and before you eat breakfast. Write down your weight to give to your doctor. °· Take your blood pressure and write it down if your doctor tells you to. °· Ask your doctor how to check your pulse. Check your pulse as told. °· Lose weight if told by your doctor. °· Stop smoking or chewing tobacco. Do not use gum or patches that help you quit without your doctor's approval. °· Schedule and go to doctor visits as told. °· Nonpregnant women should have no more than 1 drink a day. Men should have no more than 2 drinks a day. Talk to your doctor about drinking alcohol. °· Stop illegal drug use. °· Stay current with shots (immunizations). °· Manage your health conditions as told by your  doctor. °· Learn to manage your stress. °· Rest when you are tired. °· If it is really hot outside: °? Avoid intense activities. °? Use air conditioning or fans, or get in a cooler place. °? Avoid caffeine and alcohol. °? Wear loose-fitting, lightweight, and light-colored clothing. °· If it is really cold outside: °? Avoid intense activities. °? Layer your clothing. °? Wear mittens or gloves, a hat, and a scarf when going outside. °? Avoid alcohol. °· Learn about heart failure and get support as needed. °· Get help to maintain or improve your quality of life and your ability to care for yourself as needed. °Contact a doctor if: °· You gain weight quickly. °· You are more short of breath than usual. °· You cannot do your normal activities. °· You tire easily. °· You cough more than normal, especially with activity. °· You have any or more puffiness (swelling) in areas such as your hands, feet, ankles, or belly (abdomen). °· You cannot sleep because it is hard to breathe. °· You feel like your heart is beating fast (palpitations). °· You get dizzy or light-headed when you stand up. °Get help right away if: °· You have trouble breathing. °· There is a change in mental status, such as becoming less alert or not being able to focus. °· You have chest pain or discomfort. °· You faint. °This information is not intended to replace advice given to you by your health care provider. Make sure you   discuss any questions you have with your health care provider. °Document Released: 12/04/2007 Document Revised: 08/02/2015 Document Reviewed: 04/12/2012 °Elsevier Interactive Patient Education © 2017 [employer] ° °

## 2016-10-30 NOTE — Progress Notes (Signed)
Subjective: Interval History: has complaints wants to go Home. .  Objective: Vital signs in last 24 hours: Temp:  [97.5 F (36.4 C)-98.7 F (37.1 C)] 97.5 F (36.4 C) (08/23 0828) Pulse Rate:  [66-92] 92 (08/23 0620) Resp:  [15-18] 18 (08/23 0828) BP: (105-125)/(77-90) 125/90 (08/23 0828) SpO2:  [92 %-97 %] 97 % (08/23 0828) Weight:  [113.4 kg (250 lb)] 113.4 kg (250 lb) (08/23 0620) Weight change:   Intake/Output from previous day: 08/22 0701 - 08/23 0700 In: 843 [P.O.:840; I.V.:3] Out: 2225 [Urine:2225] Intake/Output this shift: No intake/output data recorded.  General appearance: alert, cooperative, no distress and moderately obese Resp: rales bibasilar Cardio: S1, S2 normal and systolic murmur: holosystolic 2/6, blowing at apex GI: obese, pos bs, liver down 5 cm Extremities: edema 1+  Lab Results:  Recent Labs  10/29/16 0407 10/30/16 0258  WBC 13.7* 10.5  HGB 12.5* 11.2*  HCT 40.2 35.1*  PLT 223 223   BMET:  Recent Labs  10/29/16 0407 10/30/16 0258  NA 136 135  K 3.8 3.8  CL 104 102  CO2 19* 22  GLUCOSE 177* 246*  BUN 40* 58*  CREATININE 3.42* 3.99*  CALCIUM 8.4* 8.6*   No results for input(s): PTH in the last 72 hours. Iron Studies: No results for input(s): IRON, TIBC, TRANSFERRIN, FERRITIN in the last 72 hours.  Studies/Results: Dg Chest 2 View  Result Date: 10/29/2016 CLINICAL DATA:  Rosary Lively 2 days ago and cardiac catheterization procedure yesterday. Clinical CHF. History of diabetes and hypertension and dyslipidemia EXAM: CHEST  2 VIEW COMPARISON:  PA and lateral chest x-ray of August 13, 2009 FINDINGS: The lungs are well-expanded. The interstitial markings are coarse. There are increased lung markings at both bases greatest on the left. There are small bilateral pleural effusions. The cardiac silhouette is enlarged. The pulmonary vascularity is mildly prominent. The patient has undergone previous CABG. IMPRESSION: Findings compatible with low-grade  interstitial edema likely of cardiac cause. Small bilateral pleural effusion. Subsegmental atelectasis or possible early pneumonia at the lung bases is noted greatest on the left. Previous CABG. Electronically Signed   By: David  [place] M.D.   On: 10/29/2016 13:50    I have reviewed the patient's current medications.  Assessment/Plan: 1 CKD 4 worsening but may reflect diuresis, but could be dye.  Can f/u outpatient.  Vol xs by exam and CXR.  Need to cont Lasix. 2 Anemia 3 HPTH pending 4 CAD per Cards 5 DM needs tight control 6 Obesity 7 BP lower with diuresis, keep off hydral P Lasix, ok to d/c from our standpoint, will f/u labs on Mon and see in 7-10d. Diet instruction, gluc control.   LOS: 4 days   Jillian Pianka L 10/30/2016,9:18 AM

## 2016-10-30 NOTE — Discharge Summary (Signed)
Physician Discharge Summary  [name] AUQ:[id number] DOB: 1950-04-22 DOA: 10/26/2016  PCP: [name], MD  Admit date: 10/26/2016 Discharge date: 10/30/2016  Admitted From: Home Discharge disposition: Home   Recommendations for Outpatient Follow-Up:   1. The patient needs close F/U of glycemic control. Started on insulin therapy while in hospital. 2. Recommend BMET in 1 week.   Discharge Diagnosis:   Principal Problem:   NSTEMI (non-ST elevated myocardial infarction) ([facility]) Active Problems:   Type 2 diabetes mellitus, uncontrolled, with renal complications ([name])   Dyslipidemia   Moderate mixed bipolar I disorder ([name])   Hypertensive heart disease without CHF   Sleep apnea   Hypothyroidism   CKD (chronic kidney disease) stage 4, GFR 15-29 ml/min ([name])   Kidney disease, chronic, stage IV (severe, EGFR 15-29 ml/min) ([place])    Discharge Condition: Improved.  Diet recommendation: Low sodium, heart healthy.  Carbohydrate-modified.   History of Present Illness:   *[name] is an 66 y.o. male with a PMH of OSA not on CPAP, hypertension, hyperlipidemia, CAD status post 5 vessel CABG, stage IV CK D, diet-controlled diabetes, gout, and bipolar disorder who was admitted 10/26/16 for evaluation of chest pain. He is under the care of Dr. Stanford Breed at baseline, and has been treated for escalating unstable angina with nitroglycerin and isosorbide. Initial troponin was 0.03 and bumped up to 6.89 on a subsequent reading. He had inferolateral ST depression and was started on heparin drip.   Hospital Course by Problem:   Principal Problem: NSTEMI  Status post cardiology evaluation with recurrent cardiac catheterization which showed 2/5 bypass grafts patent. 2-D echo recommended with medical management. PCI to RCA into acute marginal "could be considered" per cath report. He was treated with IV nitroglycerin and heparin while in the hospital. Discharge on increased  dose Imdur at 90 mg BID, aspirin and Plavix.  Active problems: Type 2 DM with vascular complications uncontrolled as evidenced by A1c of 12%.  Diabetes coordinator counseled patient 10/29/16. Discharged on Levemir 18 units daily and 8 units of meal coverage. Needs close outpatient follow up.  Hypertension Appears to be controlled. Continue current regimen: Lasix, Norvasc, Imdur.  Hyperlipidemia Continue Crestor.  Stage IV CKD Nephrology recommends outpatient nephrology F/U, and continuing Lasix.  OSA He is intolerant of CPAP.  Metabolic X syndrome Continue to control blood pressure, lipids, hyperglycemia.  Gout Improved with 2 days of prednisone. Continue allopurinol.  Obesity  Body mass index is 34.87 kg/m. Weight loss encouraged.  Bipolar disorder Continue Lamictal.     Medical Consultants:    None.   Discharge Exam:   Vitals:   10/30/16 0620 10/30/16 0828  BP: 115/83 125/90  Pulse: 92   Resp:  18  Temp: 97.6 F (36.4 C) (!) 97.5 F (36.4 C)  SpO2: 93% 97%   Vitals:   10/29/16 2105 10/30/16 0035 10/30/16 0620 10/30/16 0828  BP: 110/78 109/80 115/83 125/90  Pulse: 91 66 92   Resp: 15 15  18   Temp: 98.7 F (37.1 C) 98 F (36.7 C) 97.6 F (36.4 C) (!) 97.5 F (36.4 C)  TempSrc: Oral Oral Oral Oral  SpO2: 93% 92% 93% 97%  Weight:   113.4 kg (250 lb)   Height:        General exam: Appears calm and comfortable.  Respiratory system: Clear to auscultation. Respiratory effort normal. Cardiovascular system: S1 & S2 heard, RRR. No JVD,  rubs, gallops or clicks. No murmurs. Gastrointestinal system: Abdomen is nondistended,  soft and nontender. No organomegaly or masses felt. Normal bowel sounds heard. Central nervous system: Alert and oriented. No focal neurological deficits. Extremities: No clubbing,  or cyanosis. No edema. Skin: No rashes, lesions or ulcers. Psychiatry: Judgement and insight appear normal. Mood & affect appropriate.    The  results of significant diagnostics from this hospitalization (including imaging, microbiology, ancillary and laboratory) are listed below for reference.     Procedures and Diagnostic Studies:   No results found.   Labs:   Basic Metabolic Panel:  Recent Labs Lab 10/27/16 0807 10/27/16 1144 10/28/16 0737 10/28/16 1400 10/29/16 0407 10/30/16 0258  NA 138 137 137  --  136 135  K 4.4 4.0 4.2  --  3.8 3.8  CL 106 104 109  --  104 102  CO2 21* 19* 20*  --  19* 22  GLUCOSE 212* 216* 204*  --  177* 246*  BUN 36* 38* 38*  --  40* 58*  CREATININE 3.06* 3.05* 3.11* 3.13* 3.42* 3.99*  CALCIUM 8.5* 8.9 8.2*  --  8.4* 8.6*  PHOS  --   --   --   --  4.3 4.8*   GFR Estimated Creatinine Clearance: 24.3 mL/min (A) (by C-G formula based on SCr of 3.99 mg/dL (H)). Liver Function Tests:  Recent Labs Lab 10/27/16 1144 10/29/16 0407 10/30/16 0258  AST 140*  --   --   ALT 48  --   --   ALKPHOS 101  --   --   BILITOT 1.3*  --   --   PROT 6.8  --   --   ALBUMIN 3.6 3.1* 2.9*   No results for input(s): LIPASE, AMYLASE in the last 168 hours. No results for input(s): AMMONIA in the last 168 hours. Coagulation profile  Recent Labs Lab 10/27/16 0807  INR 0.98    CBC:  Recent Labs Lab 10/27/16 1144 10/28/16 0503 10/28/16 1400 10/29/16 0407 10/30/16 0258  WBC 22.2* 17.4* 13.2* 13.7* 10.5  HGB 15.0 13.3 13.4 12.5* 11.2*  HCT 45.7 41.2 43.8 40.2 35.1*  MCV 82.9 83.9 86.1 84.3 83.0  PLT 228 220 215 223 223   Cardiac Enzymes:  Recent Labs Lab 10/26/16 2059 10/27/16 0240 10/27/16 0807  TROPONINI 36.64* 33.10* 36.91*   BNP: Invalid input(s): POCBNP CBG:  Recent Labs Lab 10/29/16 1158 10/29/16 1701 10/29/16 2114 10/30/16 0809 10/30/16 1212  GLUCAP 238* 345* 255* 212* 188*   D-Dimer No results for input(s): DDIMER in the last 72 hours. Hgb A1c No results for input(s): HGBA1C in the last 72 hours. Lipid Profile No results for input(s): CHOL, HDL, LDLCALC, TRIG,  CHOLHDL, LDLDIRECT in the last 72 hours. Thyroid function studies No results for input(s): TSH, T4TOTAL, T3FREE, THYROIDAB in the last 72 hours.  Invalid input(s): FREET3 Anemia work up No results for input(s): VITAMINB12, FOLATE, FERRITIN, TIBC, IRON, RETICCTPCT in the last 72 hours. Microbiology No results found for this or any previous visit (from the past 240 hour(s)).   Discharge Instructions:   Discharge Instructions    Ambulatory referral to Nutrition and Diabetic Education    Complete by:  As directed    HgbA1C is 12%. Starting on insulin.   Call MD for:  extreme fatigue    Complete by:  As directed    Call MD for:  persistant dizziness or light-headedness    Complete by:  As directed    Call MD for:  severe uncontrolled pain    Complete by:  As directed  Diet - low sodium heart healthy    Complete by:  As directed    Increase activity slowly    Complete by:  As directed      Allergies as of 10/30/2016      Reactions   Nsaids Other (See Comments)   Cannot take because of kidneys      Medication List    STOP taking these medications   colchicine 0.6 MG tablet     TAKE these medications   allopurinol 100 MG tablet Commonly known as:  ZYLOPRIM Take 1 tablet (100 mg total) by mouth daily.   amLODipine 10 MG tablet Commonly known as:  NORVASC Take 1 tablet (10 mg total) by mouth daily.   aspirin 81 MG tablet Take 1 tablet (81 mg total) by mouth daily. Reported on 09/26/2015 What changed:  medication strength  how much to take   [place] w/Device Kit Use as directed. DX: E11.65   BAYER MICROLET LANCETS lancets Check blood sugars once per day. E11.65   clopidogrel 75 MG tablet Commonly known as:  PLAVIX Take 1 tablet (75 mg total) by mouth daily.   Eszopiclone 3 MG Tabs Take 3 mg by mouth at bedtime as needed (for sleep).   furosemide 80 MG tablet Commonly known as:  LASIX Take 1 tablet (80 mg total) by mouth daily.     glucose blood test strip Commonly known as:  BAYER CONTOUR NEXT TEST Check blood sugar once per day. DX: E11.65   hydrALAZINE 25 MG tablet Commonly known as:  APRESOLINE Take 1 tablet (25 mg total) by mouth 3 (three) times daily. What changed:  how much to take   insulin aspart 100 UNIT/ML FlexPen Commonly known as:  NOVOLOG FLEXPEN Inject 8 Units into the skin 3 (three) times daily with meals.   Insulin Glargine 100 UNIT/ML Solostar Pen Commonly known as:  LANTUS Inject 18 Units into the skin daily at 10 pm.   isosorbide mononitrate 30 MG 24 hr tablet Commonly known as:  IMDUR Take 3 tablets (90 mg total) by mouth 2 (two) times daily. What changed:  medication strength  how much to take  when to take this  reasons to take this   lamoTRIgine 150 MG tablet Commonly known as:  LAMICTAL Take 150 mg by mouth 2 (two) times daily.   nitroGLYCERIN 0.4 MG SL tablet Commonly known as:  NITROSTAT PLACE 1 TABLET (0.4 MG TOTAL) UNDER THE TONGUE EVERY 5 (FIVE) MINUTES AS NEEDED FOR CHEST PAIN.   pantoprazole 40 MG tablet Commonly known as:  PROTONIX Take 1 tablet (40 mg total) by mouth daily.   rosuvastatin 40 MG tablet Commonly known as:  CRESTOR Take 1 tablet (40 mg total) by mouth daily.            Discharge Care Instructions        Start     Ordered   10/31/16 0000  furosemide (LASIX) 80 MG tablet  Daily     10/30/16 1146   10/31/16 0000  allopurinol (ZYLOPRIM) 100 MG tablet  Daily     10/30/16 1146   10/30/16 0000  aspirin 81 MG tablet  Daily     10/30/16 1146   10/30/16 0000  isosorbide mononitrate (IMDUR) 30 MG 24 hr tablet  2 times daily     10/30/16 1146   10/30/16 0000  insulin aspart (NOVOLOG FLEXPEN) 100 UNIT/ML FlexPen  3 times daily with meals     10/30/16 1146  10/30/16 0000  Increase activity slowly     10/30/16 1146   10/30/16 0000  Diet - low sodium heart healthy     10/30/16 1146   10/30/16 0000  Call MD for:  severe uncontrolled pain      10/30/16 1146   10/30/16 0000  Call MD for:  persistant dizziness or light-headedness     10/30/16 1146   10/30/16 0000  Call MD for:  extreme fatigue     10/30/16 1146   10/30/16 0000  Insulin Glargine (LANTUS) 100 UNIT/ML Solostar Pen  Daily at 10 pm     10/30/16 1146   10/27/16 0000  Ambulatory referral to Nutrition and Diabetic Education    Comments:  HgbA1C is 12%. Starting on insulin.   10/27/16 1323     Follow-up Information    Lelon Perla, MD Follow up.   Specialty:  Cardiology Why:  Will call you with an appointment time. Contact information: [address] [place] [address] [phone]        Elmarie Shiley, MD Follow up.   Specialty:  Nephrology Why:  Will call with an appointment time. Contact information: [place]  [address] [phone]            Time coordinating discharge: 35 minutes.  Signed:  RAMA,CHRISTINA  Pager [phone] [facility] 10/30/2016, 10:07 PM

## 2016-10-30 NOTE — Care Management (Signed)
1303 10-30-16 Tomi Bamberger, RN,BSN [phone] Pt is aware of co pay cost of Insulin. [facility] [address]. Diabetes Educator did speak with pt in regards to education of Insulin Pens. No further needs from CM at this time.

## 2016-10-30 NOTE — Progress Notes (Signed)
PT Cancellation Note  Patient Details Name: DHYLAN WENNING MRN: [id number] DOB: 1950-07-03   Cancelled Treatment:    Reason Eval/Treat Not Completed: PT screened, no needs identified, will sign off. Pt mobilizing independently without difficulty.   Angelina Ok [place] 10/30/2016, 11:55 AM Skip Mayer PT [phone]

## 2016-10-30 NOTE — Progress Notes (Addendum)
Per insurance check for insulins # 1  S/W CIERRA  @ [employer] # [phone]   INSULIN ARE COVERED UNDER INS (vial and flex-pens) 30 day supply   1. LEVEMIR- YES -- $45 2. NOLOLOG-YES --  $45 3. LANTUS- YES -- $45  4. HUMALOG - NO  PRIOR APPROVAL- YES # [phone]   PREF[facility]

## 2016-10-30 NOTE — Progress Notes (Addendum)
Progress Note  Patient Name: [name] Date of Encounter: 10/30/2016  Primary Cardiologist: Jens Som  Subjective   No chest pain since episode occurring two days ago that was relieved by SL nitro.  No shortness of breath today, pt was able to walk around without chest pain yesterda did four loops around unit, pt asking to go home.    Inpatient Medications    Scheduled Meds: . allopurinol  100 mg Oral Daily  . amLODipine  10 mg Oral Daily  . aspirin EC  81 mg Oral Daily  . clopidogrel  75 mg Oral Daily  . diazepam  10 mg Oral Once  . furosemide  80 mg Oral Daily  . heparin  5,000 Units Subcutaneous Q8H  . insulin aspart  0-15 Units Subcutaneous TID WC  . insulin aspart  0-5 Units Subcutaneous QHS  . insulin aspart  8 Units Subcutaneous TID WC  . insulin detemir  18 Units Subcutaneous Daily  . isosorbide mononitrate  90 mg Oral BID  . lamoTRIgine  150 mg Oral BID  . pantoprazole  40 mg Oral Daily  . predniSONE  20 mg Oral QAC breakfast  . rosuvastatin  40 mg Oral Daily  . sodium chloride flush  3 mL Intravenous Q12H  . sodium chloride flush  3 mL Intravenous Q12H   Continuous Infusions: . sodium chloride    . sodium chloride    . nitroGLYCERIN Stopped (10/28/16 1103)   PRN Meds: sodium chloride, sodium chloride, acetaminophen, acetaminophen, albuterol, HYDROcodone-acetaminophen, meclizine, morphine injection, nitroGLYCERIN, ondansetron (ZOFRAN) IV, promethazine, sodium chloride flush, sodium chloride flush, zolpidem   Vital Signs    Vitals:   10/29/16 2105 10/30/16 0035 10/30/16 0620 10/30/16 0828  BP: 110/78 109/80 115/83 125/90  Pulse: 91 66 92   Resp: 15 15  18   Temp: 98.7 F (37.1 C) 98 F (36.7 C) 97.6 F (36.4 C) (!) 97.5 F (36.4 C)  TempSrc: Oral Oral Oral Oral  SpO2: 93% 92% 93% 97%  Weight:   113.4 kg (250 lb)   Height:        Intake/Output Summary (Last 24 hours) at 10/30/16 1025 Last data filed at 10/30/16 0900  Gross per 24 hour    Intake              960 ml  Output             1775 ml  Net             -815 ml   Filed Weights   10/26/16 1943 10/28/16 0430 10/30/16 0620  Weight: 115.1 kg (253 lb 12.8 oz) 115.4 kg (254 lb 8 oz) 113.4 kg (250 lb)    Telemetry    SR 2nd degree mobitz type 1 - Personally Reviewed  ECG    SR 1st degree HB - Personally Reviewed  Physical Exam   General: Well developed, well nourished, male appearing in no acute distress. Head: Normocephalic, atraumatic.  Neck: Supple without bruits, JVD. Lungs:  Resp regular and unlabored, CTA. Heart: RRR, S1, S2, no S3, S4, or murmur; no rub. Abdomen: Soft, non-tender, non-distended with normoactive bowel sounds. No hepatomegaly. No rebound/guarding. No obvious abdominal masses. Extremities: No clubbing, cyanosis, edema. Distal pedal pulses are 2+ bilaterally. Neuro: Alert and oriented X 3. Moves all extremities spontaneously. Psych: Normal affect.  Labs    Chemistry  Recent Labs Lab 10/27/16 1144 10/28/16 0737 10/28/16 1400 10/29/16 0407 10/30/16 0258  NA 137 137  --  136 135  K 4.0 4.2  --  3.8 3.8  CL 104 109  --  104 102  CO2 19* 20*  --  19* 22  GLUCOSE 216* 204*  --  177* 246*  BUN 38* 38*  --  40* 58*  CREATININE 3.05* 3.11* 3.13* 3.42* 3.99*  CALCIUM 8.9 8.2*  --  8.4* 8.6*  PROT 6.8  --   --   --   --   ALBUMIN 3.6  --   --  3.1* 2.9*  AST 140*  --   --   --   --   ALT 48  --   --   --   --   ALKPHOS 101  --   --   --   --   BILITOT 1.3*  --   --   --   --   GFRNONAA 20* 20* 20* 18* 15*  GFRAA 24* 23* 23* 20* 17*  ANIONGAP 14 8  --  13 11     Hematology  Recent Labs Lab 10/28/16 1400 10/29/16 0407 10/30/16 0258  WBC 13.2* 13.7* 10.5  RBC 5.09 4.77 4.23  HGB 13.4 12.5* 11.2*  HCT 43.8 40.2 35.1*  MCV 86.1 84.3 83.0  MCH 26.3 26.2 26.5  MCHC 30.6 31.1 31.9  RDW 14.3 14.3 14.0  PLT 215 223 223    Cardiac Enzymes  Recent Labs Lab 10/26/16 2059 10/27/16 0240 10/27/16 0807  TROPONINI 36.64*  33.10* 36.91*   No results for input(s): TROPIPOC in the last 168 hours.   BNPNo results for input(s): BNP, PROBNP in the last 168 hours.   DDimer No results for input(s): DDIMER in the last 168 hours.    Radiology    Dg Chest 2 View  Result Date: 10/29/2016 CLINICAL DATA:  Rosary Lively 2 days ago and cardiac catheterization procedure yesterday. Clinical CHF. History of diabetes and hypertension and dyslipidemia EXAM: CHEST  2 VIEW COMPARISON:  PA and lateral chest x-ray of August 13, 2009 FINDINGS: The lungs are well-expanded. The interstitial markings are coarse. There are increased lung markings at both bases greatest on the left. There are small bilateral pleural effusions. The cardiac silhouette is enlarged. The pulmonary vascularity is mildly prominent. The patient has undergone previous CABG. IMPRESSION: Findings compatible with low-grade interstitial edema likely of cardiac cause. Small bilateral pleural effusion. Subsegmental atelectasis or possible early pneumonia at the lung bases is noted greatest on the left. Previous CABG. Electronically Signed   By: David  [place] M.D.   On: 10/29/2016 13:50    Cardiac Studies     Patient Profile     66 y.o. male with PMH of CAD s/p CABG (grafts to the LAD, saphenous vein graft to the diagonal branch, vein graft sequentially to the OM 2-3 and a vein graft to the acute marginal branch of the right coronary artery), HTN, HL, DM, CKD IV, and obesity who presented with chest pain and positive troponin.   Assessment & Plan    1. NSTEMI: Presented with chest pain that was unrelieved by SL nitro at home. Trop peaked at 36.64. He was placed on IV nitro and heparin, now pain free. He was recently seen in the office by Dr. Jens Som back in 4/18 and the option of cath was discussed at that time but high concern contrast induced nephropathy. Cr baseline appears around 2.7-3.0, now 3.29 on admission. He was made NPO for cath today, but options discussed with the  patient regarding cath, and medical management  due to risk for worsening renal function. After talking with Dr. Excell Seltzer and Dr. Darrick Penna (nephrology) pt opted for the cath with the understanding that it may only be diagnostic but would be critical to his further management and pt agrees that the benefit of catheterization far outweighs the risk.    --Left heart cath performed 8/21   Prox LAD to Mid LAD lesion, 75 %stenosed. SVG to diagonal is patent. Mid LAD lesion, 100 %stenosed. LIMA to LAD is patent. Mid RCA lesion, 99 %stenosed. Distal vessel is heavily diseased. Acute Mrg lesion, 95 %stenosed. SVG to acute marginal is occluded. Prox Cx lesion, 100 %stenosed. SVG to OM2 is patent. Sequential graft from OM2 to OM3 is occluded. LV end diastolic pressure is moderately elevated. There is no aortic valve stenosis.  --no stent placement indicated during this procedure,  lesion in proximal LAD may be interrogated further in the future but for now pt already given 90cc contrast during diagnostic phase of cath. Risk benefit ratio of stenting this lesion is in favor of medical management for now and follow up.    -Pt tolerated procedure well and is not in pain at this time.   --IMDUR increased to 90mg  BID  2. CKD IV: Known renal disease. States he has been seen by Dr. Allena Katz as an outpatient. Per his report, further interventions were not recommend at this time by nephrologist.  Baseline Cr appears around 2.7-3.0. Was 3.2 on admission.  --Pt had chest pain episode and shortness of breath yesterday afternoon 10/28/16 around 4pm, had bibasilar rales pt was given 120mg  iv lasix with good diuresis of around 4L and improvement in symptoms. --Creatinine increased to 3.99 this am 3.42 on 10/29/16 from 3.13 the day prior --Pt needs close follow up with Dr. Allena Katz nephrology w/in the next 7-10 days --pt staying overnight will check renal function in am --colchicine dced, pt started on allopurinol instead.   --  only 90cc contrast used during heart cath procedure  3. HFrEF: estimated ejection fraction 35-40%, mild MR,  --Pt will require PO lasix at home, will start with 80mg Qdaily --Pt counseled on decreased salt intake --Pt counseled to buy a good accurate scale and take daily weights, shown how to look for signs of fluid overload.    4. HTN: Diastolic remains elevated. Currently on norvasc 10mg  daily, and hydralazine 50mg  TID. No BB given HB, no ACEi/ARB 2/2 CKD. --Per nephrology agree that with addition of lasix can consider discontinuation of hydralazine.   Adjust as needed based on outpatient BP monitoring.   5. HL: LDL 102 (7/18), on Crestor 40mg  daily.  6. DM: Hgb A1c 12.0 this admission. Reports he was dx, unsure when, and given a meter/insulin but never started medications.  -- Diabetes coordinator following -- Pt getting more comfortable giving himself insulin  7.OSA: last sleep study four years ago, was supposed to be on CPAP since then but has not been using due to difficulty sleeping wearing it  --Explained increased importance of CPAP given his extensive coronary disease and likely decreased cardiac function. --Pt willing to try wearing cpap and changing to a different type of mask to help him tolerate it.  Signed, Thornell Mule, MD  10/30/2016, 10:25 AM    Patient seen, examined. Available data reviewed. Agree with findings, assessment, and plan as outlined by Dr Frances Furbish. Changes made where appropriate.   My exam today: Vitals:   10/30/16 0620 10/30/16 0828  BP: 115/83 125/90  Pulse: 92   Resp:  18  Temp: 97.6 F (36.4 C) (!) 97.5 F (36.4 C)  SpO2: 93% 97%   Pt is alert and oriented, NAD HEENT: normal Neck: JVP - normal Lungs: CTA bilaterally CV: RRR without murmur or gallop Abd: soft, NT, Positive BS, no hepatomegaly Ext: Trace pretibial edema right greater than left, distal pulses intact and equal Skin: warm/dry no rash  Reviewed echo findings. Patient's  LVEF is 35-40%. Discussed medications at length with the patient today. He understands the need to take much better care of himself. He has had multiple untreated or undertreated medical problems due to noncompliance. He is going to be started on insulin. His cardiac medications are reviewed and he will continue on amlodipine, isosorbide, furosemide, aspirin, and clopidogrel. A beta blocker was considered but outpatient notes indicate periods of 2-1 heart block and it appears a beta blocker is contraindicated at this time. He is not a candidate for ACE or ARB because of advanced kidney disease. Appreciate Dr. Deterding's care of this patient during his hospitalization. He will see Dr. Allena Katz back in follow-up in 7-10 days with labs. We will arrange follow-up with Dr. Jens Som within a few weeks as well. Otherwise as outlined above.    Tonny Bollman, M.D. 10/30/2016 11:14 AM

## 2016-10-31 ENCOUNTER — Telehealth: Payer: Self-pay | Admitting: Cardiology

## 2016-10-31 ENCOUNTER — Other Ambulatory Visit: Payer: Self-pay | Admitting: Cardiology

## 2016-10-31 DIAGNOSIS — R079 Chest pain, unspecified: Secondary | ICD-10-CM

## 2016-10-31 MED ORDER — AMLODIPINE BESYLATE 10 MG PO TABS: 10.0000 mg | ORAL_TABLET | Freq: Every day | ORAL | 3 refills | Status: AC

## 2016-10-31 NOTE — Telephone Encounter (Signed)
New message   Pt is out of medication    *STAT* If patient is at the pharmacy, call can be transferred to refill team.   1. Which medications need to be refilled? (please list name of each medication and dose if known) amLODipine (NORVASC) 10 MG tablet(Expired) Take 1 tablet (10 mg total) by mouth daily.     2. Which pharmacy/location (including street and city if local pharmacy) is medication to be sent to? [facility] [place] [phone]  3. Do they need a 30 day or 90 day supply? 90

## 2016-10-31 NOTE — Telephone Encounter (Signed)
New message      Pt want to talk to a nurse.  He had a heart attack 6 days ago and is having "symptoms".  However, he would not tell me what the symptoms are

## 2016-10-31 NOTE — Telephone Encounter (Signed)
Spoke with pt, he reports extreme fatigue, he was discharged yesterday. He denies CP or SOB. He reports that he was recently started on insulin in the hospital and has taken several doses. He has not gotten the equipment he needs to check his blood sugar. Advised patient to check his blood sugar asap. He maybe feeling fatigue related to his blood sugar. He is going asap to get whast is needed. He was advised to contact his medical doctor if he gets abnormal blood sugar readings. Patient voiced understanding to call bacl if his symptoms change or worsen.

## 2016-10-31 NOTE — Telephone Encounter (Signed)
Contacted to Pt to inform that medication is refilled at the requested pharmacy.

## 2016-11-24 ENCOUNTER — Ambulatory Visit: Payer: BLUE CROSS/BLUE SHIELD | Admitting: Physician Assistant

## 2016-11-24 NOTE — Progress Notes (Deleted)
Cardiology Office Note    Date:  11/24/2016   ID:  [name], DOB Dec 06, 1950, MRN [id number]  PCP:  [name], MD  Cardiologist:  Dr. Stanford Breed Primary nephrologist: Dr. Posey Pronto   No chief complaint on file.   History of Present Illness:  [name] is a 66 y.o. male with PMH of HTN, HLD, CKD stage IV, DM, bipolar disorder. He Had NSTEMI in April 2011, cardiac catheterization at the time showed severe native CAD. He ultimately underwent bypass in May 2011 with LIMA to LAD, SVG to diagonal, SVG to OM 2/OM 3, SVG to acute marginal. The in 2016 showed EF is 55%, moderate LVH, severe LAE, grade 2 DD. Beta blocker was discontinued previously due to presence of Mobitz 1 A-V bloc. Myoview in January 2018 showed EF 55%, small area of mid anterior wall ischemia. Holter monitor obtained in January 2018 shows sinus rhythm, first-degree AV block, PACs, PVCs, Mobitz 1 and 2-1 AV block. Cardiac catheterization was previously recommended because of continuing exertional chest discomfort, however patient delayed because of risk of contrast nephropathy. He was last seen by Dr. Stanford Breed on 09/23/2016, he still wished to pursue medical therapy at that time.  He recently presented to the hospital on 10/26/2016 with NSTEMI. He ultimately underwent cardiac catheterization on 10/28/2016 which showed a 70% proximal to mid LAD lesion, SVG to diagonal was patent, 100% mid LAD lesion, LIMA to LAD patent, 99% mid RCA lesion with a heavily diseased distal vessel, 95% acute marginal lesion with occluded SVG to acute marginal. 100% proximal left circumflex lesion with patent SVG to OM 2, although sequential graft from OM 2 to OM 3 was occluded, this was felt to be the culprit lesion. Medical therapy was recommended. Echocardiogram obtained on 10/09/2016 showed EF 35-40%, moderate LVH, mild MR, moderately to severely dilated LA, moderately dilated RA. EF was down from 55-60% compare to echo in October 2016.  Postprocedure, his Imdur was increased to 90 mg daily. He was also diuresed for volume overload. He did have some degree of contrast nephropathy after the cardiac catheterization. He will be followed closely by Dr. Posey Pronto with nephrology service as outpatient. He also has untreated diabetes and OSA related to noncompliance, he was started on insulin during this admission. Hemoglobin A1c during this admission was 12.0.  No EKG unless recurrent chest pain since cath BMET   Past Medical History:  Diagnosis Date  . Benign prostatic hypertrophy   . Bipolar affective disorder, mixed, moderate ([facility])   . CAD (coronary artery disease)   . Diabetes mellitus    type II  . Gout   . 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 Hyperlipidemia   . Hypertension   . 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 Lumbar disc disease    ruptured disc with sciatica  . Nephrolithiasis   . Obesity   . Pancreatitis   . Sleep apnea   . 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 ANKLE ARTHROSCOPY    . ANKLE SURGERY     Left  . BASAL CELL CARCINOMA EXCISION  09/26/15  . CORONARY ARTERY BYPASS GRAFT  07/25/2009  . COSMETIC SURGERY     chin  . LEFT HEART CATH AND CORS/GRAFTS ANGIOGRAPHY N/A 10/28/2016   Procedure: LEFT HEART CATH AND CORS/GRAFTS ANGIOGRAPHY;  Surgeon: Jettie Booze, MD;  Location: [facility];  Service: Cardiovascular;  Laterality:  N/A;  . LIPOSUCTION    . NASAL SEPTUM SURGERY      Current Medications: Outpatient Medications Prior to Visit  Medication Sig Dispense Refill  . allopurinol (ZYLOPRIM) 100 MG tablet Take 1 tablet (100 mg total) by mouth daily. 30 tablet 2  . amLODipine (NORVASC) 10 MG tablet TAKE 1 TABLET (10 MG TOTAL) BY MOUTH DAILY. PLEASE SCHEDULE APPOINTMENT FOR REFILLS. 90 tablet 0  . amLODipine (NORVASC) 10 MG tablet Take 1 tablet (10 mg total) by mouth daily. 90 tablet 3  . aspirin 81 MG tablet  Take 1 tablet (81 mg total) by mouth daily. Reported on 09/26/2015    . BAYER MICROLET LANCETS lancets Check blood sugars once per day. E11.65 (Patient not taking: Reported on 10/27/2016) 100 each 2  . Blood Glucose Monitoring Suppl (BAYER CONTOUR NEXT MONITOR) w/Device KIT Use as directed. DX: E11.65 (Patient not taking: Reported on 10/27/2016) 1 kit 0  . clopidogrel (PLAVIX) 75 MG tablet Take 1 tablet (75 mg total) by mouth daily. 90 tablet 3  . Eszopiclone 3 MG TABS Take 3 mg by mouth at bedtime as needed (for sleep).   2  . furosemide (LASIX) 80 MG tablet Take 1 tablet (80 mg total) by mouth daily. 30 tablet 2  . glucose blood (BAYER CONTOUR NEXT TEST) test strip Check blood sugar once per day. DX: E11.65 (Patient not taking: Reported on 10/27/2016) 100 each 2  . hydrALAZINE (APRESOLINE) 25 MG tablet Take 1 tablet (25 mg total) by mouth 3 (three) times daily. (Patient taking differently: Take 50 mg by mouth 3 (three) times daily. ) 90 tablet 5  . insulin aspart (NOVOLOG FLEXPEN) 100 UNIT/ML FlexPen Inject 8 Units into the skin 3 (three) times daily with meals. 15 mL 11  . Insulin Glargine (LANTUS) 100 UNIT/ML Solostar Pen Inject 18 Units into the skin daily at 10 pm. 15 mL 11  . isosorbide mononitrate (IMDUR) 30 MG 24 hr tablet Take 3 tablets (90 mg total) by mouth 2 (two) times daily. 60 tablet 2  . lamoTRIgine (LAMICTAL) 150 MG tablet Take 150 mg by mouth 2 (two) times daily.  11  . nitroGLYCERIN (NITROSTAT) 0.4 MG SL tablet PLACE 1 TABLET (0.4 MG TOTAL) UNDER THE TONGUE EVERY 5 (FIVE) MINUTES AS NEEDED FOR CHEST PAIN. 25 tablet 2  . pantoprazole (PROTONIX) 40 MG tablet Take 1 tablet (40 mg total) by mouth daily. 90 tablet 3  . rosuvastatin (CRESTOR) 40 MG tablet Take 1 tablet (40 mg total) by mouth daily. 90 tablet 3   No facility-administered medications prior to visit.      Allergies:   Nsaids   Social History   Social History  . Marital status: Married    Spouse name: N/A  . Number of  children: N/A  . Years of education: N/A   Occupational History  . sportscaster    Social History Main Topics  . Smoking status: Never Smoker  . Smokeless tobacco: Never Used  . Alcohol use 0.0 oz/week     Comment: rare  . Drug use: No  . Sexual activity: Not on file   Other Topics Concern  . Not on file   Social History Narrative   Currently married, is a Architectural technologist     Family History:  The patient's ***family history includes Cancer in his mother; Depression in his mother; Diabetes in his brother and father; Heart attack in his father.   ROS:   Please see the history of present illness.  ROS All other systems reviewed and are negative.   PHYSICAL EXAM:   VS:  There were no vitals taken for this visit.   GEN: Well nourished, well developed, in no acute distress  HEENT: normal  Neck: no JVD, carotid bruits, or masses Cardiac: ***RRR; no murmurs, rubs, or gallops,no edema  Respiratory:  clear to auscultation bilaterally, normal work of breathing GI: soft, nontender, nondistended, + BS MS: no deformity or atrophy  Skin: warm and dry, no rash Neuro:  Alert and Oriented x 3, Strength and sensation are intact Psych: euthymic mood, full affect  Wt Readings from Last 3 Encounters:  10/30/16 250 lb (113.4 kg)  09/23/16 254 lb (115.2 kg)  06/17/16 255 lb (115.7 kg)      Studies/Labs Reviewed:   EKG:  EKG is*** ordered today.  The ekg ordered today demonstrates ***  Recent Labs: 10/26/2016: TSH 6.298 10/27/2016: ALT 48 10/30/2016: BUN 58; Creatinine, Ser 3.99; Hemoglobin 11.2; Platelets 223; Potassium 3.8; Sodium 135   Lipid Panel    Component Value Date/Time   CHOL 192 09/23/2016 0000   TRIG 281 (H) 09/23/2016 0000   TRIG 103 02/11/2006 0846   HDL 34 (L) 09/23/2016 0000   CHOLHDL 5.6 (H) 09/23/2016 0000   CHOLHDL 6 09/07/2015 0907   VLDL 39.0 09/07/2015 0907   LDLCALC 102 (H) 09/23/2016 0000   LDLDIRECT 135.1 02/21/2008 0752    Additional studies/  records that were reviewed today include:    Myoview 04/03/2016 Study Highlights     The left ventricular ejection fraction is mildly decreased (45-54%).  Nuclear stress EF: 50%.  There was no ST segment deviation noted during stress.  Defect 1: There is a small defect of mild severity present in the apical anterior location.  Findings consistent with prior myocardial infarction with peri-infarct ischemia.  This is an intermediate risk study.   The patient has baseline Wenckebach periods that progressed into 2:1 block after regadenoson infusion and persisted for 19 minutes into recovery. Then back to intermittent Wenckebach periods.   There is a small area, mild severity, reversible defect in the mid anterior wall consistent with ischemia (SDS = 2).  LVEF calculated at 50% but visually appears higher      Cath 10/28/2016 Conclusion     Prox LAD to Mid LAD lesion, 75 %stenosed. SVG to diagonal is patent.  Mid LAD lesion, 100 %stenosed. LIMA to LAD is patent.  Mid RCA lesion, 99 %stenosed. Distal vessel is heavily diseased.  Acute Mrg lesion, 95 %stenosed. SVG to acute marginal is occluded.  Prox Cx lesion, 100 %stenosed. SVG to OM2 is patent. Sequential graft from OM2 to OM3 is occluded.  LV end diastolic pressure is moderately elevated.  There is no aortic valve stenosis.   2/5 bypass grafts patent.  Elevated LVEDP.  Manage medically at this time.  Check echo.    PCI to RCA into acute marginal could be considered.  I suspect the culprit vessel was the second half of sequential graft to the OM3, now occluded.    Echo 10/29/2016 LV EF: 35% -   40%  ------------------------------------------------------------------- Indications:      CAD of bypass graft 414.05.  ------------------------------------------------------------------- History:   PMH:   Coronary artery disease.  Risk factors: Hypertension. Diabetes  mellitus.  ------------------------------------------------------------------- Study Conclusions  - Left ventricle: Inferior wall hypokinesis The cavity size was   normal. Wall thickness was increased in a pattern of moderate   LVH. Systolic function was moderately reduced. The estimated  ejection fraction was in the range of 35% to 40%. - Mitral valve: There was mild regurgitation. Valve area by   continuity equation (using LVOT flow): 2.25 cm^2. - Left atrium: The atrium was moderately to severely dilated. - Right ventricle: The cavity size was severely dilated. - Right atrium: The atrium was moderately dilated. - Atrial septum: No defect or patent foramen ovale was identified.   ASSESSMENT:    No diagnosis found.   PLAN:  In order of problems listed above:  1. ***    Medication Adjustments/Labs and Tests Ordered: Current medicines are reviewed at length with the patient today.  Concerns regarding medicines are outlined above.  Medication changes, Labs and Tests ordered today are listed in the Patient Instructions below. There are no Patient Instructions on file for this visit.   Hilbert Corrigan, [place]  11/24/2016 6:50 AM    [address], [place], [place]  [address] Phone: [phone]; Fax: [phone]

## 2016-11-27 ENCOUNTER — Encounter: Payer: Self-pay | Admitting: Family Medicine

## 2016-12-08 DEATH — deceased

## 2017-01-19 ENCOUNTER — Ambulatory Visit: Payer: BLUE CROSS/BLUE SHIELD | Admitting: Cardiology

## 2018-02-26 IMAGING — CR DG CHEST 2V
2 series · 2 of 2 positions shown · non-contrast
Comparison: PA and lateral chest x-ray August 13, 2009

CLINICAL DATA: [place] 2 days ago and cardiac catheterization
procedure yesterday. Clinical CHF. History of diabetes and
hypertension and dyslipidemia

EXAM:
CHEST  2 VIEW

[chest pa]
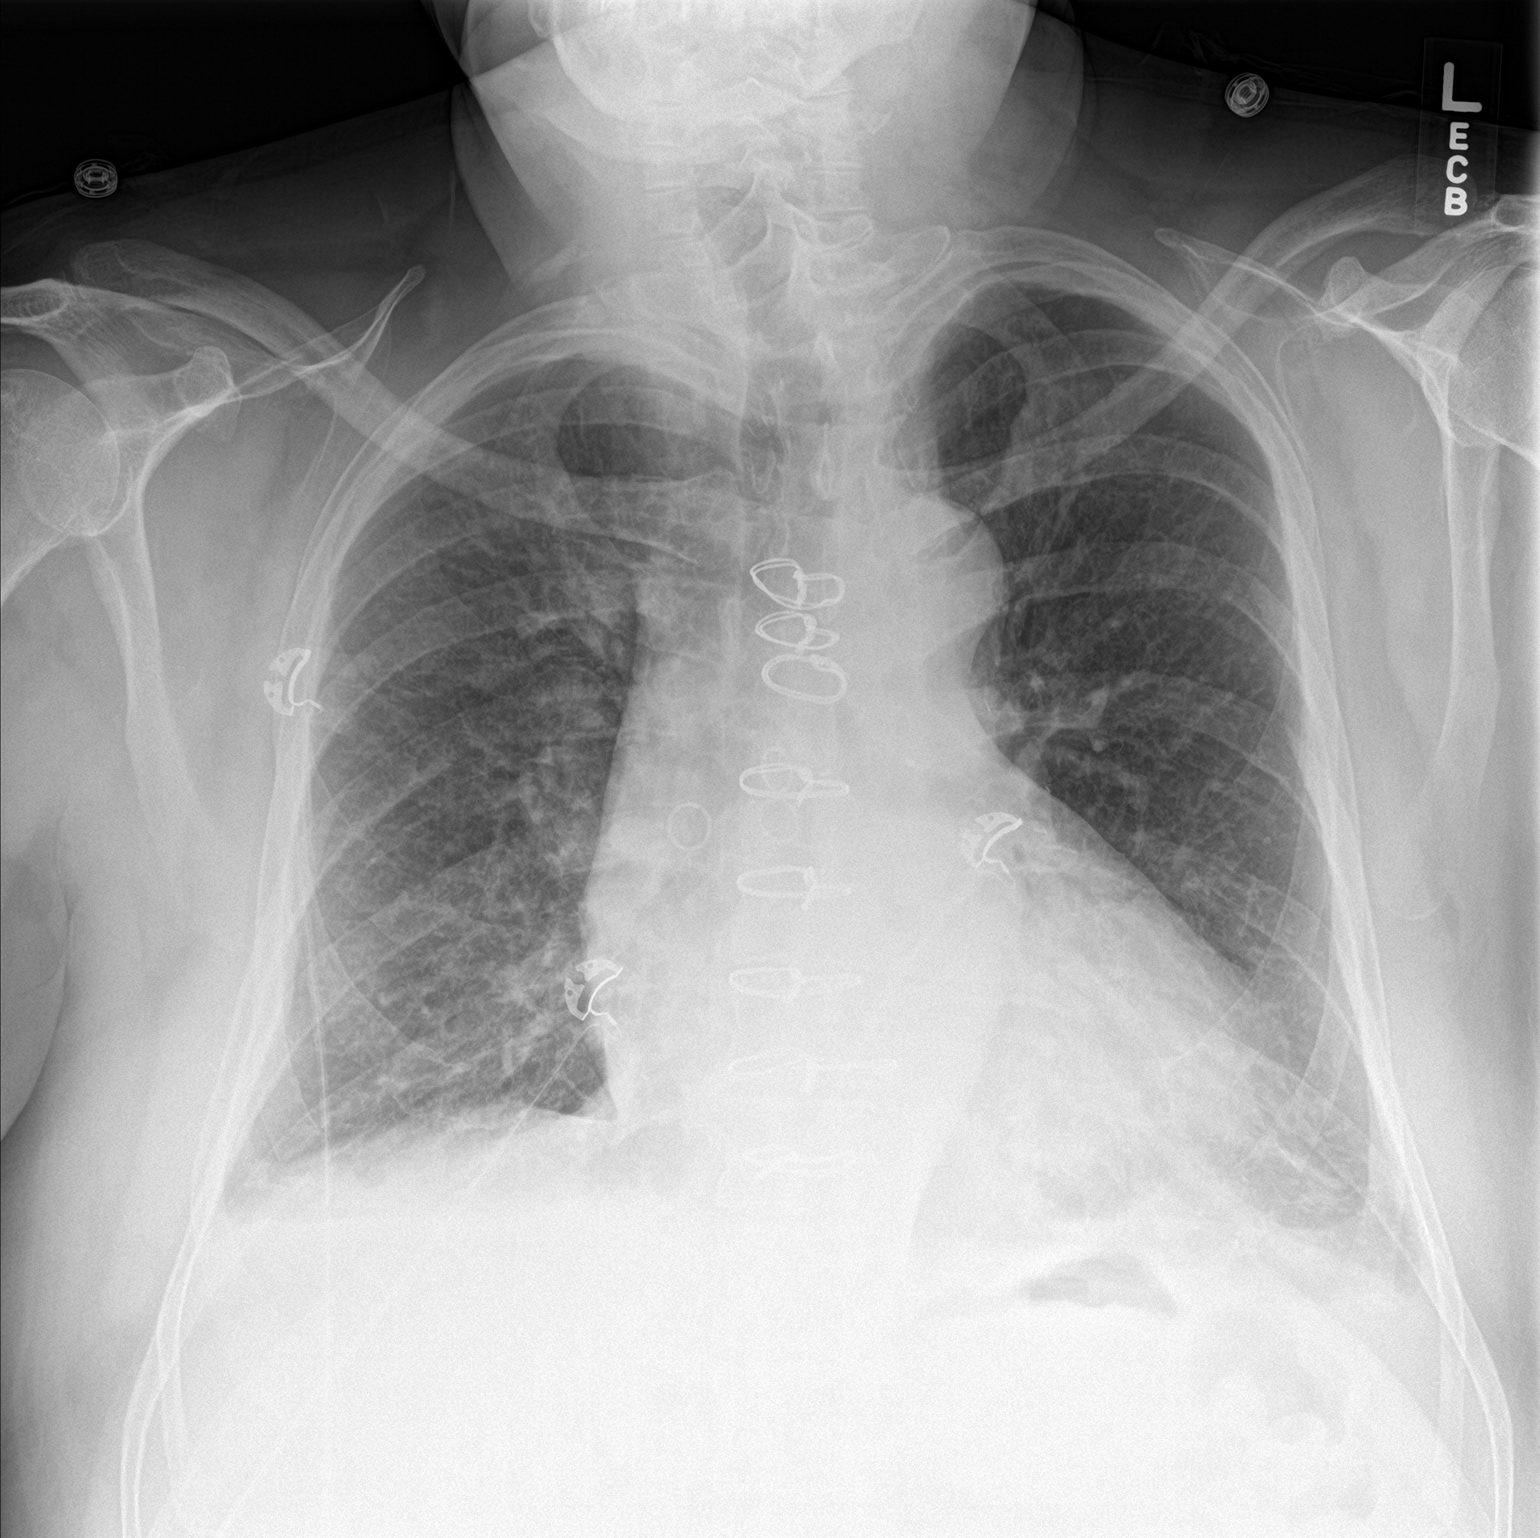

[chest lat]
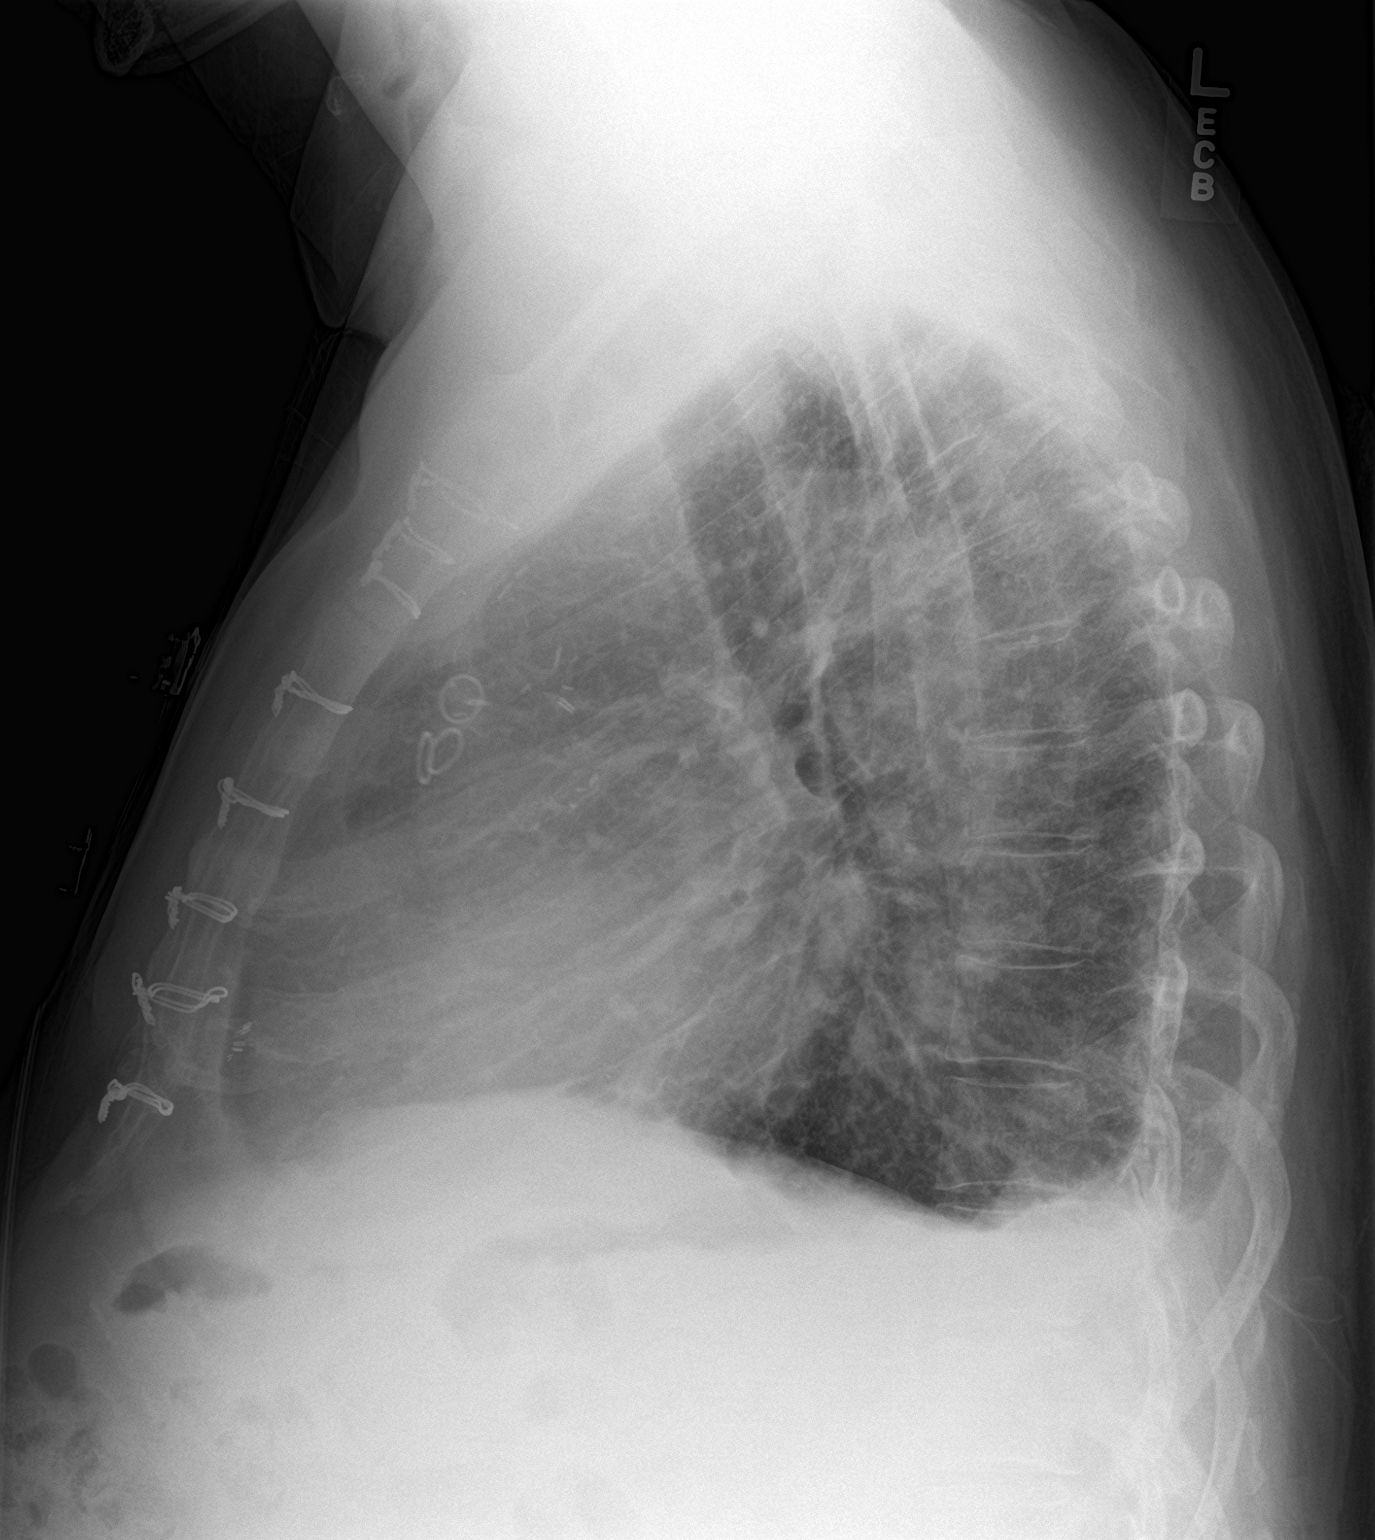

[2 of 2 positions shown; findings below may reference images not displayed]

FINDINGS: The lungs are well-expanded. The interstitial markings are coarse.
There are increased lung markings at both bases greatest on the
left. There are small bilateral pleural effusions. The cardiac
silhouette is enlarged. The pulmonary vascularity is mildly
prominent. The patient has undergone previous CABG.
IMPRESSION: Findings compatible with low-grade interstitial edema likely of
cardiac cause. Small bilateral pleural effusion. Subsegmental
atelectasis or possible early pneumonia at the lung bases is noted
greatest on the left.

Previous CABG.
